# Patient Record
Sex: Female | Born: 1980 | Race: Black or African American | Hispanic: No | Marital: Married | Smoking: Never smoker
Health system: Southern US, Community
[De-identification: ages and names within clinical notes are randomized; demographics above are authoritative.]

## PROBLEM LIST (undated history)

## (undated) DIAGNOSIS — R7611 Nonspecific reaction to tuberculin skin test without active tuberculosis: Secondary | ICD-10-CM

## (undated) HISTORY — DX: Nonspecific reaction to tuberculin skin test without active tuberculosis: R76.11

## (undated) HISTORY — PX: UMBILICAL HERNIA REPAIR: SHX196

---

## 2011-10-06 DIAGNOSIS — S0502XA Injury of conjunctiva and corneal abrasion without foreign body, left eye, initial encounter: Secondary | ICD-10-CM | POA: Insufficient documentation

## 2011-10-06 DIAGNOSIS — S058X9A Other injuries of unspecified eye and orbit, initial encounter: Secondary | ICD-10-CM | POA: Insufficient documentation

## 2014-10-04 DIAGNOSIS — R42 Dizziness and giddiness: Secondary | ICD-10-CM | POA: Insufficient documentation

## 2014-11-03 DIAGNOSIS — B951 Streptococcus, group B, as the cause of diseases classified elsewhere: Secondary | ICD-10-CM | POA: Insufficient documentation

## 2016-08-28 DIAGNOSIS — Z111 Encounter for screening for respiratory tuberculosis: Secondary | ICD-10-CM | POA: Diagnosis not present

## 2016-08-28 DIAGNOSIS — O0992 Supervision of high risk pregnancy, unspecified, second trimester: Secondary | ICD-10-CM | POA: Diagnosis not present

## 2016-08-28 DIAGNOSIS — O0930 Supervision of pregnancy with insufficient antenatal care, unspecified trimester: Secondary | ICD-10-CM | POA: Diagnosis not present

## 2016-08-31 ENCOUNTER — Other Ambulatory Visit (HOSPITAL_COMMUNITY): Payer: Self-pay | Admitting: Family Medicine

## 2016-08-31 ENCOUNTER — Ambulatory Visit
Admission: RE | Admit: 2016-08-31 | Discharge: 2016-08-31 | Disposition: A | Discharge status: Home / Self Care | Payer: Medicaid Other | Source: Ambulatory Visit | Attending: Family Medicine | Admitting: Family Medicine

## 2016-08-31 DIAGNOSIS — R7611 Nonspecific reaction to tuberculin skin test without active tuberculosis: Secondary | ICD-10-CM | POA: Insufficient documentation

## 2016-09-15 DIAGNOSIS — O280 Abnormal hematological finding on antenatal screening of mother: Secondary | ICD-10-CM | POA: Insufficient documentation

## 2016-09-18 DIAGNOSIS — O3510X0 Maternal care for (suspected) chromosomal abnormality in fetus, unspecified, not applicable or unspecified: Secondary | ICD-10-CM | POA: Insufficient documentation

## 2017-08-28 LAB — HM PAP SMEAR: HM Pap smear: NEGATIVE

## 2017-08-28 LAB — RESULTS CONSOLE HPV: CHL HPV: NEGATIVE

## 2017-10-22 DIAGNOSIS — Z3201 Encounter for pregnancy test, result positive: Secondary | ICD-10-CM | POA: Diagnosis not present

## 2017-10-29 DIAGNOSIS — O99012 Anemia complicating pregnancy, second trimester: Secondary | ICD-10-CM | POA: Diagnosis not present

## 2017-10-29 DIAGNOSIS — O09522 Supervision of elderly multigravida, second trimester: Secondary | ICD-10-CM | POA: Diagnosis not present

## 2017-10-29 DIAGNOSIS — O0992 Supervision of high risk pregnancy, unspecified, second trimester: Secondary | ICD-10-CM | POA: Diagnosis not present

## 2017-11-07 DIAGNOSIS — Z8759 Personal history of other complications of pregnancy, childbirth and the puerperium: Secondary | ICD-10-CM | POA: Diagnosis not present

## 2017-11-07 DIAGNOSIS — Z3689 Encounter for other specified antenatal screening: Secondary | ICD-10-CM | POA: Diagnosis not present

## 2017-11-26 DIAGNOSIS — O0992 Supervision of high risk pregnancy, unspecified, second trimester: Secondary | ICD-10-CM | POA: Diagnosis not present

## 2017-11-26 DIAGNOSIS — O99012 Anemia complicating pregnancy, second trimester: Secondary | ICD-10-CM | POA: Diagnosis not present

## 2017-11-26 DIAGNOSIS — Z23 Encounter for immunization: Secondary | ICD-10-CM | POA: Diagnosis not present

## 2018-02-04 DIAGNOSIS — O0992 Supervision of high risk pregnancy, unspecified, second trimester: Secondary | ICD-10-CM | POA: Diagnosis not present

## 2018-02-19 DIAGNOSIS — O0992 Supervision of high risk pregnancy, unspecified, second trimester: Secondary | ICD-10-CM | POA: Diagnosis not present

## 2018-02-25 DIAGNOSIS — O0992 Supervision of high risk pregnancy, unspecified, second trimester: Secondary | ICD-10-CM | POA: Diagnosis not present

## 2018-03-04 DIAGNOSIS — O0992 Supervision of high risk pregnancy, unspecified, second trimester: Secondary | ICD-10-CM | POA: Diagnosis not present

## 2018-03-11 DIAGNOSIS — Z315 Encounter for genetic counseling: Secondary | ICD-10-CM | POA: Diagnosis not present

## 2018-03-11 DIAGNOSIS — O99012 Anemia complicating pregnancy, second trimester: Secondary | ICD-10-CM | POA: Diagnosis not present

## 2018-03-11 DIAGNOSIS — Z22330 Carrier of Group B streptococcus: Secondary | ICD-10-CM | POA: Diagnosis not present

## 2018-03-11 DIAGNOSIS — Z6790 Unspecified blood type, Rh positive: Secondary | ICD-10-CM | POA: Diagnosis not present

## 2018-03-11 DIAGNOSIS — O0930 Supervision of pregnancy with insufficient antenatal care, unspecified trimester: Secondary | ICD-10-CM | POA: Diagnosis not present

## 2018-03-11 DIAGNOSIS — O0992 Supervision of high risk pregnancy, unspecified, second trimester: Secondary | ICD-10-CM | POA: Diagnosis not present

## 2018-03-11 DIAGNOSIS — Z8759 Personal history of other complications of pregnancy, childbirth and the puerperium: Secondary | ICD-10-CM | POA: Diagnosis not present

## 2018-03-11 DIAGNOSIS — Z1159 Encounter for screening for other viral diseases: Secondary | ICD-10-CM | POA: Diagnosis not present

## 2018-03-11 DIAGNOSIS — Z7189 Other specified counseling: Secondary | ICD-10-CM | POA: Diagnosis not present

## 2018-03-11 DIAGNOSIS — R7611 Nonspecific reaction to tuberculin skin test without active tuberculosis: Secondary | ICD-10-CM | POA: Diagnosis not present

## 2018-03-18 DIAGNOSIS — O0992 Supervision of high risk pregnancy, unspecified, second trimester: Secondary | ICD-10-CM | POA: Diagnosis not present

## 2018-03-18 DIAGNOSIS — O4193X0 Disorder of amniotic fluid and membranes, unspecified, third trimester, not applicable or unspecified: Secondary | ICD-10-CM | POA: Diagnosis not present

## 2018-03-18 DIAGNOSIS — O88113 Amniotic fluid embolism in pregnancy, third trimester: Secondary | ICD-10-CM | POA: Diagnosis not present

## 2018-03-18 DIAGNOSIS — Z886 Allergy status to analgesic agent status: Secondary | ICD-10-CM | POA: Diagnosis not present

## 2018-03-18 DIAGNOSIS — Z3A39 39 weeks gestation of pregnancy: Secondary | ICD-10-CM | POA: Diagnosis not present

## 2018-03-20 DIAGNOSIS — O99019 Anemia complicating pregnancy, unspecified trimester: Secondary | ICD-10-CM | POA: Diagnosis not present

## 2018-03-20 DIAGNOSIS — O351XX0 Maternal care for (suspected) chromosomal abnormality in fetus, not applicable or unspecified: Secondary | ICD-10-CM | POA: Diagnosis not present

## 2018-03-20 DIAGNOSIS — Z3A39 39 weeks gestation of pregnancy: Secondary | ICD-10-CM | POA: Diagnosis not present

## 2018-03-20 DIAGNOSIS — O09522 Supervision of elderly multigravida, second trimester: Secondary | ICD-10-CM | POA: Diagnosis not present

## 2018-03-21 DIAGNOSIS — O99213 Obesity complicating pregnancy, third trimester: Secondary | ICD-10-CM | POA: Diagnosis not present

## 2018-03-21 DIAGNOSIS — E669 Obesity, unspecified: Secondary | ICD-10-CM | POA: Diagnosis not present

## 2018-03-21 DIAGNOSIS — O133 Gestational [pregnancy-induced] hypertension without significant proteinuria, third trimester: Secondary | ICD-10-CM | POA: Diagnosis not present

## 2018-03-21 DIAGNOSIS — Z3A39 39 weeks gestation of pregnancy: Secondary | ICD-10-CM | POA: Diagnosis not present

## 2018-03-21 DIAGNOSIS — O368390 Maternal care for abnormalities of the fetal heart rate or rhythm, unspecified trimester, not applicable or unspecified: Secondary | ICD-10-CM | POA: Diagnosis not present

## 2018-03-21 DIAGNOSIS — O24419 Gestational diabetes mellitus in pregnancy, unspecified control: Secondary | ICD-10-CM | POA: Diagnosis not present

## 2018-05-02 DIAGNOSIS — Z01419 Encounter for gynecological examination (general) (routine) without abnormal findings: Secondary | ICD-10-CM | POA: Diagnosis not present

## 2018-10-14 ENCOUNTER — Telehealth: Payer: Self-pay

## 2018-10-14 DIAGNOSIS — R7611 Nonspecific reaction to tuberculin skin test without active tuberculosis: Secondary | ICD-10-CM | POA: Insufficient documentation

## 2018-10-14 NOTE — Telephone Encounter (Signed)
TC to patient re: hx of +PPD and LTBI tx.  Patient declines LTBI tx at this time but will f/u with [facility] if decides to proceed. Aileen Fass, RN

## 2018-11-29 DIAGNOSIS — Z23 Encounter for immunization: Secondary | ICD-10-CM | POA: Diagnosis not present

## 2018-11-29 DIAGNOSIS — Z03818 Encounter for observation for suspected exposure to other biological agents ruled out: Secondary | ICD-10-CM | POA: Diagnosis not present

## 2018-11-29 DIAGNOSIS — Z7189 Other specified counseling: Secondary | ICD-10-CM | POA: Diagnosis not present

## 2018-11-29 DIAGNOSIS — Z20828 Contact with and (suspected) exposure to other viral communicable diseases: Secondary | ICD-10-CM | POA: Diagnosis not present

## 2018-12-22 IMAGING — CR DG CHEST 1V
1 series · 1 of 1 positions shown · non-contrast
Comparison: None.

CLINICAL DATA: Positive PPD, 16 weeks pregnant, lead shielded

EXAM:
CHEST 1 VIEW

[chest pa]
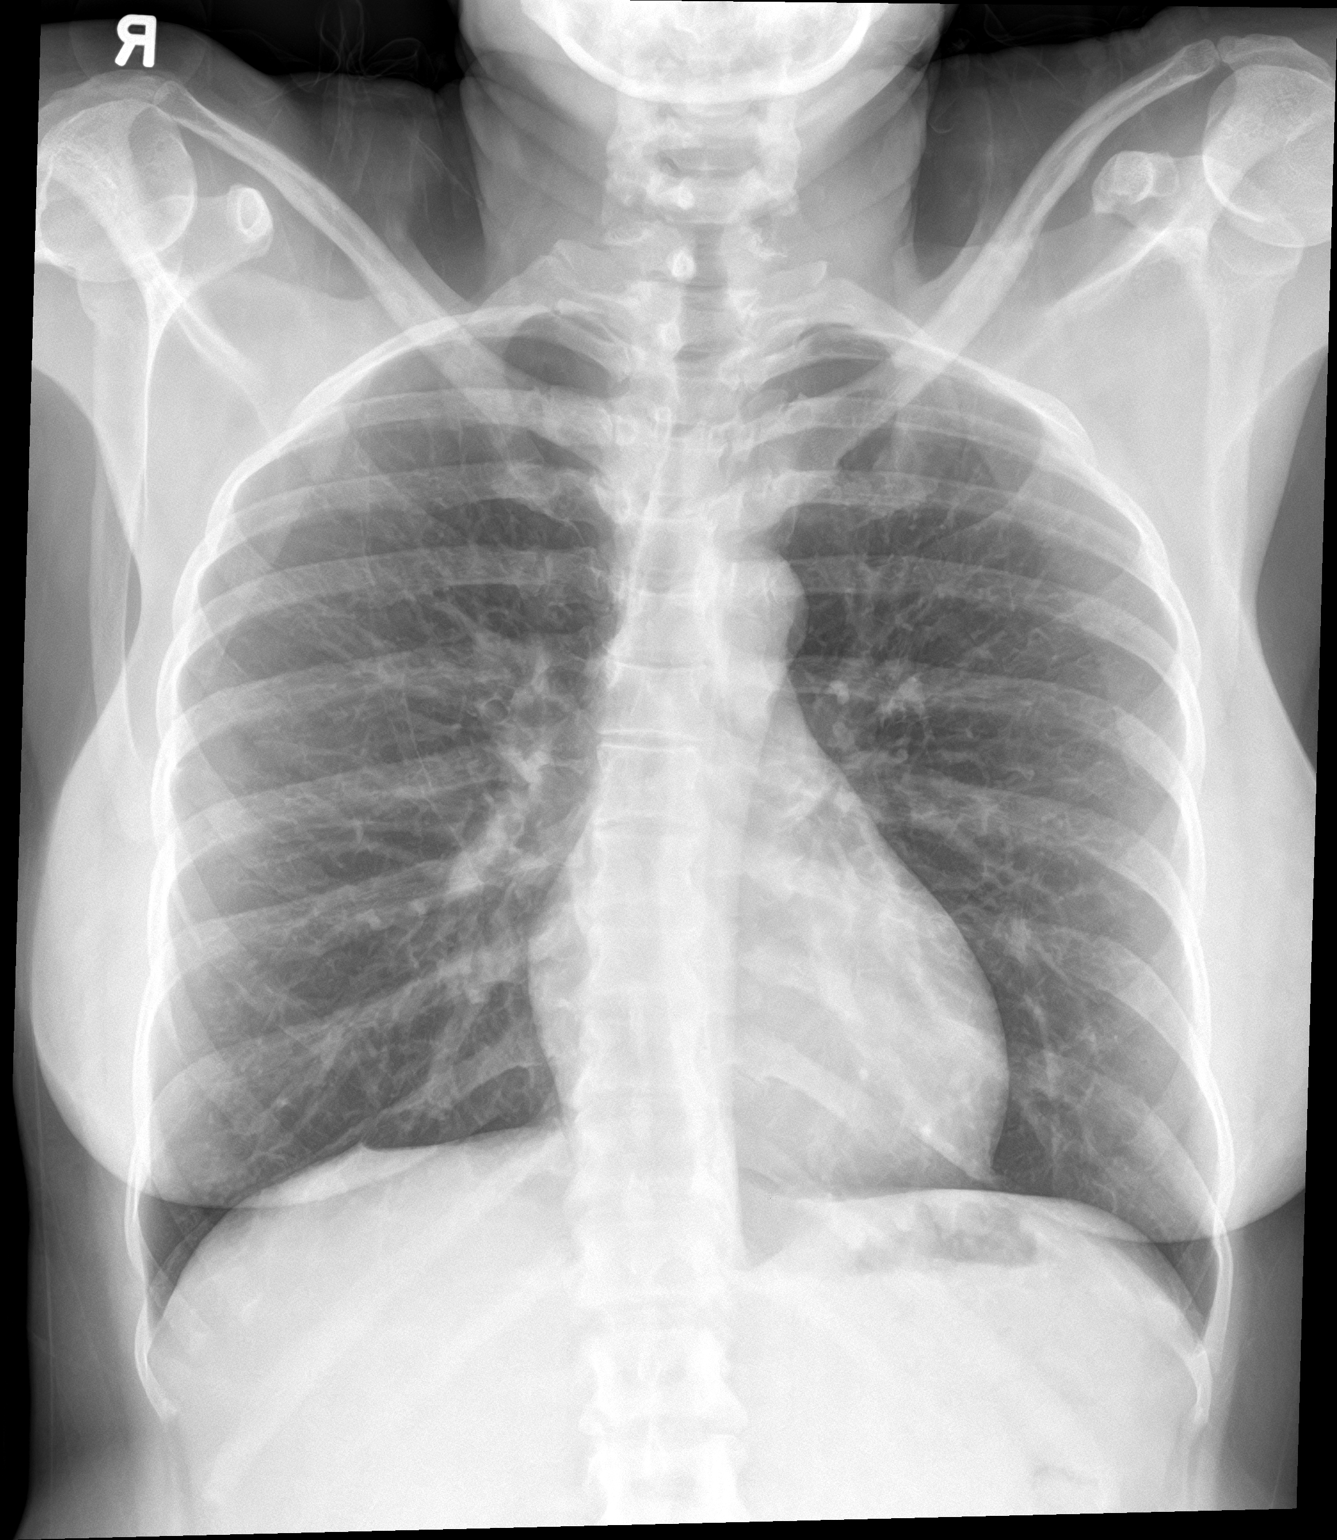

[1 of 1 positions shown; findings below may reference images not displayed]

FINDINGS: No active infiltrate or effusion is seen. No sequela of prior
tuberculous infection is noted. Mediastinal and hilar contours are
unremarkable. The heart is within normal limits in size. No bony
abnormality is seen.
IMPRESSION: No active disease.

## 2019-05-23 DIAGNOSIS — Z23 Encounter for immunization: Secondary | ICD-10-CM | POA: Diagnosis not present

## 2019-06-09 ENCOUNTER — Other Ambulatory Visit: Payer: Self-pay

## 2019-06-09 ENCOUNTER — Encounter: Payer: Self-pay | Admitting: Family Medicine

## 2019-06-09 ENCOUNTER — Ambulatory Visit (INDEPENDENT_AMBULATORY_CARE_PROVIDER_SITE_OTHER): Payer: Medicaid Other | Admitting: Family Medicine

## 2019-06-09 VITALS — BP 110/80 | HR 72 | Ht 62.0 in | Wt 134.0 lb

## 2019-06-09 DIAGNOSIS — Z862 Personal history of diseases of the blood and blood-forming organs and certain disorders involving the immune mechanism: Secondary | ICD-10-CM

## 2019-06-09 DIAGNOSIS — M20021 Boutonniere deformity of right finger(s): Secondary | ICD-10-CM

## 2019-06-09 DIAGNOSIS — Z7689 Persons encountering health services in other specified circumstances: Secondary | ICD-10-CM | POA: Diagnosis not present

## 2019-06-09 NOTE — Progress Notes (Signed)
Date:  06/09/2019   Name:  Erika West   DOB:  Dec 27, 1980   MRN:  [id number]   Chief Complaint: Establish Care (needs pcp) and finger deformity (R) pinky finger- fell and "dislocated finger"- now doesn't feel as flexible as the other fingers)  Patient is a 39 year old female who presents for a establish care exam. The patient reports the following problems: finger deformity after fall/> 1 year ago.Marland Kitchen Health maintenance has been reviewed needs pap.   No results found for: CREATININE, BUN, NA, K, CL, CO2 No results found for: CHOL, HDL, LDLCALC, LDLDIRECT, TRIG, CHOLHDL No results found for: TSH No results found for: HGBA1C No results found for: WBC, HGB, HCT, MCV, PLT No results found for: ALT, AST, GGT, ALKPHOS, BILITOT   Review of Systems  Constitutional: Negative.  Negative for chills, fatigue, fever and unexpected weight change.  HENT: Negative for congestion, ear discharge, ear pain, rhinorrhea, sinus pressure, sneezing and sore throat.   Eyes: Negative for photophobia, pain, discharge, redness and itching.  Respiratory: Negative for cough, shortness of breath, wheezing and stridor.   Gastrointestinal: Negative for abdominal pain, blood in stool, constipation, diarrhea, nausea and vomiting.  Endocrine: Negative for cold intolerance, heat intolerance, polydipsia, polyphagia and polyuria.  Genitourinary: Negative for dysuria, flank pain, frequency, hematuria, menstrual problem, pelvic pain, urgency, vaginal bleeding and vaginal discharge.  Musculoskeletal: Negative for arthralgias, back pain and myalgias.  Skin: Negative for rash.  Allergic/Immunologic: Negative for environmental allergies and food allergies.  Neurological: Negative for dizziness, weakness, light-headedness, numbness and headaches.  Hematological: Negative for adenopathy. Does not bruise/bleed easily.  Psychiatric/Behavioral: Negative for dysphoric mood. The patient is not nervous/anxious.      Patient Active Problem List   Diagnosis Date Noted  . 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 Abnormal maternal serum screening test 09/15/2016    Allergies  Allergen Reactions  . Aspirin Other (See Comments)    ulcers Ulcer, told not to take it   . Sulfamethoxazole-Trimethoprim Other (See Comments)    History of ulcer so was advised by MD not to take    History reviewed. No pertinent surgical history.  Social History   Tobacco Use  . Smoking status: Never Smoker  . Smokeless tobacco: Never Used  Substance Use Topics  . Alcohol use: Not Currently    Alcohol/week: 1.0 standard drinks    Types: 1 Glasses of wine per week    Comment: occasional  . Drug use: Never     Medication list has been reviewed and updated.  Current Meds  Medication Sig  . Prenatal Vit-Fe Fumarate-FA (RIGHT STEP PRENATAL) 27-0.8 MG TABS Take by mouth.    PHQ 2/9 Scores 06/09/2019  PHQ - 2 Score 0  PHQ- 9 Score 0    BP Readings from Last 3 Encounters:  06/09/19 110/80    Physical Exam Vitals and nursing note reviewed.  Constitutional:      General: She is not in acute distress.    Appearance: She is not diaphoretic.  HENT:     Head: Normocephalic and atraumatic.     Right Ear: Tympanic membrane, ear canal and external ear normal.     Left Ear: Tympanic membrane, ear canal and external ear normal.     Nose: Nose normal. No congestion or rhinorrhea.     Mouth/Throat:     Mouth: Mucous membranes are moist. Mucous membranes are pale.  Eyes:     General: Lids are normal.  Right eye: No discharge.        Left eye: No discharge.     Conjunctiva/sclera: Conjunctivae normal.     Pupils: Pupils are equal, round, and reactive to light.  Neck:     Thyroid: No thyromegaly.     Vascular: No JVD.  Cardiovascular:     Rate and Rhythm: Normal rate and regular rhythm.     Heart sounds: Normal heart sounds. No murmur. No friction rub. No gallop.   Pulmonary:     Effort: Pulmonary effort is  normal.     Breath sounds: Normal breath sounds.  Abdominal:     General: Bowel sounds are normal.     Palpations: Abdomen is soft. There is no mass.     Tenderness: There is no abdominal tenderness. There is no guarding.  Musculoskeletal:        General: Normal range of motion.     Cervical back: Normal range of motion and neck supple.  Lymphadenopathy:     Cervical: No cervical adenopathy.  Skin:    General: Skin is warm and dry.     Capillary Refill: Capillary refill takes 2 to 3 seconds.  Neurological:     Mental Status: She is alert.     Deep Tendon Reflexes: Reflexes are normal and symmetric.     Wt Readings from Last 3 Encounters:  06/09/19 134 lb (60.8 kg)    BP 110/80   Pulse 72   Ht 5\' 2"  (1.575 m)   Wt 134 lb (60.8 kg)   BMI 24.51 kg/m   Assessment and Plan:  1. Establishing care with new doctor, encounter for Patient establishing care with new physician.  Patient's previous encounters were reviewed as well as most recent labs, most recent imaging, and care everywhere.  Concerns as noted below on history and physical exam were determined.  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 - Ambulatory referral to Orthopedic Surgery  3. History of anemia Patient has a history of anemia on review of her history.  Exam is consistent with paleness of the conjunctiva and mucous membranes and nailbeds as well.  We will obtain a CBC with differential and indices for evaluation and determination of further therapy pending. - CBC with Differential/Platelet

## 2019-06-10 LAB — CBC WITH DIFFERENTIAL/PLATELET
Basophils Absolute: 0.1 10*3/uL (ref 0.0–0.2)
Basos: 1 %
EOS (ABSOLUTE): 0.2 10*3/uL (ref 0.0–0.4)
Eos: 2 %
Hematocrit: 41.3 % (ref 34.0–46.6)
Hemoglobin: 13 g/dL (ref 11.1–15.9)
Immature Grans (Abs): 0 10*3/uL (ref 0.0–0.1)
Immature Granulocytes: 0 %
Lymphocytes Absolute: 4.4 10*3/uL — ABNORMAL HIGH (ref 0.7–3.1)
Lymphs: 57 %
MCH: 27.8 pg (ref 26.6–33.0)
MCHC: 31.5 g/dL (ref 31.5–35.7)
MCV: 88 fL (ref 79–97)
Monocytes Absolute: 0.3 10*3/uL (ref 0.1–0.9)
Monocytes: 4 %
Neutrophils Absolute: 2.8 10*3/uL (ref 1.4–7.0)
Neutrophils: 36 %
Platelets: 355 10*3/uL (ref 150–450)
RBC: 4.67 x10E6/uL (ref 3.77–5.28)
RDW: 13.1 % (ref 11.7–15.4)
WBC: 7.7 10*3/uL (ref 3.4–10.8)

## 2019-06-23 DIAGNOSIS — M20021 Boutonniere deformity of right finger(s): Secondary | ICD-10-CM | POA: Diagnosis not present

## 2019-06-23 DIAGNOSIS — M79644 Pain in right finger(s): Secondary | ICD-10-CM | POA: Diagnosis not present

## 2019-07-28 ENCOUNTER — Encounter: Payer: Self-pay | Admitting: Family Medicine

## 2019-07-28 ENCOUNTER — Ambulatory Visit (INDEPENDENT_AMBULATORY_CARE_PROVIDER_SITE_OTHER): Payer: Medicaid Other | Admitting: Family Medicine

## 2019-07-28 ENCOUNTER — Other Ambulatory Visit: Payer: Self-pay

## 2019-07-28 VITALS — BP 120/70 | HR 64 | Ht 62.0 in | Wt 136.0 lb

## 2019-07-28 DIAGNOSIS — Z862 Personal history of diseases of the blood and blood-forming organs and certain disorders involving the immune mechanism: Secondary | ICD-10-CM

## 2019-07-28 DIAGNOSIS — Z0000 Encounter for general adult medical examination without abnormal findings: Secondary | ICD-10-CM | POA: Diagnosis not present

## 2019-07-28 NOTE — Progress Notes (Signed)
Date:  07/28/2019   Name:  Erika West   DOB:  1980-08-28   MRN:  [id number]   Chief Complaint: Annual Exam ( no issues- had pap last year)  Patient is a 39 year old female who presents for a comprehensive physical exam. The patient reports the following problems: none. Health maintenance has been reviewed up to date/pap health dept.   No results found for: CREATININE, BUN, NA, K, CL, CO2 No results found for: CHOL, HDL, LDLCALC, LDLDIRECT, TRIG, CHOLHDL No results found for: TSH No results found for: HGBA1C Lab Results  Component Value Date   WBC 7.7 06/09/2019   HGB 13.0 06/09/2019   HCT 41.3 06/09/2019   MCV 88 06/09/2019   PLT 355 06/09/2019   No results found for: ALT, AST, GGT, ALKPHOS, BILITOT   Review of Systems  Constitutional: Negative.  Negative for chills, fatigue, fever and unexpected weight change.  HENT: Negative for congestion, ear discharge, ear pain, rhinorrhea, sinus pressure, sneezing and sore throat.   Eyes: Negative for photophobia, pain, discharge, redness and itching.  Respiratory: Negative for cough, shortness of breath, wheezing and stridor.   Gastrointestinal: Negative for abdominal pain, blood in stool, constipation, diarrhea, nausea and vomiting.  Endocrine: Negative for cold intolerance, heat intolerance, polydipsia, polyphagia and polyuria.  Genitourinary: Negative for dysuria, flank pain, frequency, hematuria, menstrual problem, pelvic pain, urgency, vaginal bleeding and vaginal discharge.  Musculoskeletal: Negative for arthralgias, back pain and myalgias.  Skin: Negative for rash.  Allergic/Immunologic: Negative for environmental allergies and food allergies.  Neurological: Negative for dizziness, weakness, light-headedness, numbness and headaches.  Hematological: Negative for adenopathy. Does not bruise/bleed easily.  Psychiatric/Behavioral: Negative for dysphoric mood. The patient is not nervous/anxious.     Patient Active  Problem List   Diagnosis Date Noted  . 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 Abnormal maternal serum screening test 09/15/2016    Allergies  Allergen Reactions  . Aspirin Other (See Comments)    ulcers Ulcer, told not to take it   . Sulfamethoxazole-Trimethoprim Other (See Comments)    History of ulcer so was advised by MD not to take    No past surgical history on file.  Social History   Tobacco Use  . Smoking status: Never Smoker  . Smokeless tobacco: Never Used  Substance Use Topics  . Alcohol use: Not Currently    Alcohol/week: 1.0 standard drink    Types: 1 Glasses of wine per week    Comment: occasional  . Drug use: Never     Medication list has been reviewed and updated.  Current Meds  Medication Sig  . Prenatal Vit-Fe Fumarate-FA (PNV PRENATAL PLUS MULTIVITAMIN) 27-1 MG TABS Take by mouth.    PHQ 2/9 Scores 07/28/2019 06/09/2019  PHQ - 2 Score 0 0  PHQ- 9 Score 0 0    GAD 7 : Generalized Anxiety Score 07/28/2019 06/09/2019  Nervous, Anxious, on Edge 0 0  Control/stop worrying 0 0  Worry too much - different things 0 0  Trouble relaxing 0 0  Restless 0 0  Easily annoyed or irritable 0 0  Afraid - awful might happen 0 0  Total GAD 7 Score 0 0    BP Readings from Last 3 Encounters:  07/28/19 120/70  06/09/19 110/80    Physical Exam Vitals and nursing note reviewed.  Constitutional:      General: She is not in acute distress.    Appearance: She is not diaphoretic.  HENT:  Head: Normocephalic and atraumatic.     Right Ear: Tympanic membrane, ear canal and external ear normal.     Left Ear: Tympanic membrane, ear canal and external ear normal.     Nose: Nose normal.  Eyes:     General:        Right eye: No discharge.        Left eye: No discharge.     Conjunctiva/sclera: Conjunctivae normal.     Pupils: Pupils are equal, round, and reactive to light.  Neck:     Thyroid: No thyromegaly.     Vascular: No JVD.  Cardiovascular:     Rate and Rhythm:  Normal rate and regular rhythm.     Heart sounds: Normal heart sounds, S1 normal and S2 normal. No murmur heard.  No systolic murmur is present.  No diastolic murmur is present.  No friction rub. No gallop. No S3 or S4 sounds.   Pulmonary:     Effort: Pulmonary effort is normal.     Breath sounds: Normal breath sounds. No decreased breath sounds, wheezing, rhonchi or rales.  Chest:     Breasts:        Right: Normal. No swelling, bleeding, inverted nipple, mass, nipple discharge, skin change or tenderness.        Left: Normal. No swelling, bleeding, inverted nipple, mass, nipple discharge, skin change or tenderness.  Abdominal:     General: Bowel sounds are normal.     Palpations: Abdomen is soft. There is no mass.     Tenderness: There is no abdominal tenderness. There is no guarding.  Genitourinary:    Rectum: Normal. Guaiac result negative. No mass or tenderness.  Musculoskeletal:        General: Normal range of motion.     Cervical back: Normal range of motion and neck supple.     Right lower leg: No edema.     Left lower leg: No edema.  Lymphadenopathy:     Cervical: No cervical adenopathy.     Upper Body:     Right upper body: No supraclavicular or axillary adenopathy.     Left upper body: No supraclavicular or axillary adenopathy.  Skin:    General: Skin is warm and dry.  Neurological:     Mental Status: She is alert.     Deep Tendon Reflexes: Reflexes are normal and symmetric.     Wt Readings from Last 3 Encounters:  07/28/19 136 lb (61.7 kg)  06/09/19 134 lb (60.8 kg)    BP 120/70   Pulse 64   Ht 5\' 2"  (1.575 m)   Wt 136 lb (61.7 kg)   LMP 06/24/2019 (Approximate)   Breastfeeding Yes   BMI 24.87 kg/m   Assessment and Plan: 1. Annual physical exam No subjective/objective concerns noted during history and physical exam.  Patient's chart was reviewed and there were no issues noted in her previous encounters, most recent labs, most recent imaging, and care  everywhere.Erika West is a 39 y.o. female who presents today for her Complete Annual Exam. She feels well. She reports exercising occassional. She reports she is sleeping well. Immunizations are reviewed and recommendations provided.   Age appropriate screening tests are discussed. Counseling given for risk factor reduction interventions.  We will evaluate with renal function panel lipid panel as well as CBC given history of anemia. - Renal Function Panel - Lipid Panel With LDL/HDL Ratio - CBC with Differential/Platelet  2. History of anemia Chronic.  Controlled.  Stable.  Will check CBC given the patient's history of anemia and mild decrease in hemoglobin/hematocrit in the past. - CBC with Differential/Platelet

## 2019-07-29 LAB — CBC WITH DIFFERENTIAL/PLATELET
Basophils Absolute: 0.1 10*3/uL (ref 0.0–0.2)
Basos: 1 %
EOS (ABSOLUTE): 0.1 10*3/uL (ref 0.0–0.4)
Eos: 2 %
Hematocrit: 41.4 % (ref 34.0–46.6)
Hemoglobin: 13.1 g/dL (ref 11.1–15.9)
Immature Grans (Abs): 0 10*3/uL (ref 0.0–0.1)
Immature Granulocytes: 0 %
Lymphocytes Absolute: 3.2 10*3/uL — ABNORMAL HIGH (ref 0.7–3.1)
Lymphs: 48 %
MCH: 27.5 pg (ref 26.6–33.0)
MCHC: 31.6 g/dL (ref 31.5–35.7)
MCV: 87 fL (ref 79–97)
Monocytes Absolute: 0.3 10*3/uL (ref 0.1–0.9)
Monocytes: 5 %
Neutrophils Absolute: 2.9 10*3/uL (ref 1.4–7.0)
Neutrophils: 44 %
Platelets: 311 10*3/uL (ref 150–450)
RBC: 4.77 x10E6/uL (ref 3.77–5.28)
RDW: 13 % (ref 11.7–15.4)
WBC: 6.5 10*3/uL (ref 3.4–10.8)

## 2019-07-29 LAB — RENAL FUNCTION PANEL
Albumin: 4.8 g/dL (ref 3.8–4.8)
BUN/Creatinine Ratio: 14 (ref 9–23)
BUN: 9 mg/dL (ref 6–20)
CO2: 22 mmol/L (ref 20–29)
Calcium: 9.6 mg/dL (ref 8.7–10.2)
Chloride: 103 mmol/L (ref 96–106)
Creatinine, Ser: 0.66 mg/dL (ref 0.57–1.00)
GFR calc Af Amer: 130 mL/min/{1.73_m2} (ref >59)
GFR calc non Af Amer: 112 mL/min/{1.73_m2} (ref >59)
Glucose: 93 mg/dL (ref 65–99)
Phosphorus: 3.8 mg/dL (ref 3.0–4.3)
Potassium: 4.8 mmol/L (ref 3.5–5.2)
Sodium: 140 mmol/L (ref 134–144)

## 2019-07-29 LAB — LIPID PANEL WITH LDL/HDL RATIO
Cholesterol, Total: 192 mg/dL (ref 100–199)
HDL: 66 mg/dL (ref >39)
LDL Chol Calc (NIH): 114 mg/dL — ABNORMAL HIGH (ref 0–99)
LDL/HDL Ratio: 1.7 ratio (ref 0.0–3.2)
Triglycerides: 65 mg/dL (ref 0–149)
VLDL Cholesterol Cal: 12 mg/dL (ref 5–40)

## 2019-07-30 ENCOUNTER — Telehealth: Payer: Self-pay | Admitting: Family Medicine

## 2019-07-30 NOTE — Telephone Encounter (Unsigned)
Copied from CRM [id number]. Topic: General - Call Back - No Documentation >> Jul 30, 2019  2:24 PM Reuben Likes D wrote: Reason for CRM: Patient received voicemail advising her to contact the office back, patient is unsure reason for call. Patient has already spoke to nurse regarding lab results  On 07/29/2019

## 2019-07-31 NOTE — Telephone Encounter (Signed)
Called pt back concerning lab being acceptable

## 2019-10-13 ENCOUNTER — Encounter: Payer: Self-pay | Admitting: Family Medicine

## 2019-10-13 ENCOUNTER — Other Ambulatory Visit: Payer: Self-pay

## 2019-10-13 ENCOUNTER — Ambulatory Visit (INDEPENDENT_AMBULATORY_CARE_PROVIDER_SITE_OTHER): Payer: Medicaid Other | Admitting: Family Medicine

## 2019-10-13 VITALS — BP 112/82 | HR 76 | Ht 62.0 in | Wt 132.0 lb

## 2019-10-13 DIAGNOSIS — Z23 Encounter for immunization: Secondary | ICD-10-CM | POA: Diagnosis not present

## 2019-10-13 DIAGNOSIS — L700 Acne vulgaris: Secondary | ICD-10-CM

## 2019-10-13 MED ORDER — ERYTHROMYCIN BASE 500 MG PO TABS: 500.0000 mg | ORAL_TABLET | Freq: Two times a day (BID) | ORAL | 1 refills | Status: DC

## 2019-10-13 MED ORDER — ERYTHROMYCIN BASE 500 MG PO TABS: 500.0000 mg | ORAL_TABLET | Freq: Two times a day (BID) | ORAL | 0 refills | Status: DC

## 2019-10-13 NOTE — Progress Notes (Signed)
Date:  10/13/2019   Name:  Erika West   DOB:  1980/11/25   MRN:  [id number]   Chief Complaint: Rash (X1 month, face (all over), hurts and burns, gives her a headache ) and Flu Vaccine  Rash This is a recurrent (2013-2014) problem. The current episode started more than 1 month ago (4-6 weeks). The problem has been gradually worsening since onset. The affected locations include the face. Rash characteristics: comedones. Associated with: using coconut oil. Treatments tried: topical coconut oil. The treatment provided mild relief.    Lab Results  Component Value Date   CREATININE 0.66 07/28/2019   BUN 9 07/28/2019   NA 140 07/28/2019   K 4.8 07/28/2019   CL 103 07/28/2019   CO2 22 07/28/2019   Lab Results  Component Value Date   CHOL 192 07/28/2019   HDL 66 07/28/2019   LDLCALC 114 (H) 07/28/2019   TRIG 65 07/28/2019   No results found for: TSH No results found for: HGBA1C Lab Results  Component Value Date   WBC 6.5 07/28/2019   HGB 13.1 07/28/2019   HCT 41.4 07/28/2019   MCV 87 07/28/2019   PLT 311 07/28/2019   No results found for: ALT, AST, GGT, ALKPHOS, BILITOT   Review of Systems  Skin: Positive for rash.    Patient Active Problem List   Diagnosis Date Noted  . 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 Abnormal maternal serum screening test 09/15/2016    Allergies  Allergen Reactions  . Aspirin Other (See Comments)    ulcers Ulcer, told not to take it   . Sulfamethoxazole-Trimethoprim Other (See Comments)    History of ulcer so was advised by MD not to take    History reviewed. No pertinent surgical history.  Social History   Tobacco Use  . Smoking status: Never Smoker  . Smokeless tobacco: Never Used  Substance Use Topics  . Alcohol use: Not Currently    Alcohol/week: 1.0 standard drink    Types: 1 Glasses of wine per week    Comment: occasional  . Drug use: Never     Medication list has been reviewed and updated.  Current Meds    Medication Sig  . acetaminophen (TYLENOL) 500 MG tablet Take by mouth as needed.   . Prenatal Vit-Fe Fumarate-FA (PNV PRENATAL PLUS MULTIVITAMIN) 27-1 MG TABS Take by mouth.    PHQ 2/9 Scores 10/13/2019 07/28/2019 06/09/2019  PHQ - 2 Score 0 0 0  PHQ- 9 Score 0 0 0    GAD 7 : Generalized Anxiety Score 10/13/2019 07/28/2019 06/09/2019  Nervous, Anxious, on Edge 0 0 0  Control/stop worrying 0 0 0  Worry too much - different things 0 0 0  Trouble relaxing 0 0 0  Restless 0 0 0  Easily annoyed or irritable 0 0 0  Afraid - awful might happen 0 0 0  Total GAD 7 Score 0 0 0  Anxiety Difficulty Not difficult at all - -    BP Readings from Last 3 Encounters:  10/13/19 112/82  07/28/19 120/70  06/09/19 110/80    Physical Exam Vitals and nursing note reviewed.  Constitutional:      General: She is not in acute distress.    Appearance: She is not diaphoretic.  HENT:     Head: Normocephalic and atraumatic.     Right Ear: External ear normal.     Left Ear: External ear normal.     Nose: Nose normal.  Eyes:  General:        Right eye: No discharge.        Left eye: No discharge.     Conjunctiva/sclera: Conjunctivae normal.     Pupils: Pupils are equal, round, and reactive to light.  Neck:     Thyroid: No thyromegaly.     Vascular: No JVD.  Cardiovascular:     Rate and Rhythm: Normal rate and regular rhythm.     Heart sounds: Normal heart sounds. No murmur heard.  No friction rub. No gallop.   Pulmonary:     Effort: Pulmonary effort is normal.     Breath sounds: Normal breath sounds.  Abdominal:     General: Bowel sounds are normal.     Palpations: Abdomen is soft. There is no mass.     Tenderness: There is no abdominal tenderness. There is no guarding.  Musculoskeletal:        General: Normal range of motion.     Cervical back: Normal range of motion and neck supple.  Lymphadenopathy:     Cervical: No cervical adenopathy.  Skin:    General: Skin is warm and dry.      Findings: Rash present. Rash is papular and pustular.     Comments: Open and closed comedones  Neurological:     Mental Status: She is alert.     Deep Tendon Reflexes: Reflexes are normal and symmetric.     Wt Readings from Last 3 Encounters:  10/13/19 132 lb (59.9 kg)  07/28/19 136 lb (61.7 kg)  06/09/19 134 lb (60.8 kg)    BP 112/82   Pulse 76   Ht 5\' 2"  (1.575 m)   Wt 132 lb (59.9 kg)   BMI 24.14 kg/m   Assessment and Plan: 1. Need for immunization against influenza Discussed and administered - Flu Vaccine QUAD 36+ mos IM  2. Acne vulgaris New onset.  Persistent.  Gradually worsening/uncontrolled.  Patient began having acneiform-like breakout of the crusted forehead and eventually involving the malar areas of the face chin.  There are papules, pustules, and comedones noted.  Patient was on a contraceptive patch which is been discontinued in March.  Patient is continue to breast-feed which limits some of our options.  We will avoid clindamycin and instead use erythromycin 500 mg twice a day.  Patient has been instructed to use a daily cleansing with nonperfumed, nonmoisturizing-based soap and the pat dry and not to traumatize the papules.  Appointment has been made with dermatology for further evaluation and treatment.  Patient has been using a coconut oil preparation on her face and she has been instructed to continue its use and that it may be exacerbating it from the standpoint of the oral/coconut preparation.  Patient is not currently sexually active however we may need to involve her GYN for further evaluation and perhaps selection of a birth control that could help her with her acne but not interfere with her lactation. - Ambulatory referral to Dermatology - erythromycin base (E-MYCIN) 500 MG tablet; Take 1 tablet (500 mg total) by mouth 2 (two) times daily.  Dispense: 60 tablet; Refill: 1

## 2019-10-13 NOTE — Patient Instructions (Signed)
Acne  Acne is a skin problem that causes pimples and other skin changes. The skin has many tiny openings called pores. Each pore contains an oil gland. Oil glands make an oily substance that is called sebum. Acne occurs when the pores in the skin get blocked. The pores may become infected with bacteria, or they may become red, sore, and swollen. Acne is a common skin problem, especially for teenagers. It often occurs on the face, neck, chest, upper arms, and back. Acne usually goes away over time. What are the causes? Acne is caused when oil glands get blocked with sebum, dead skin cells, and dirt. The bacteria that are normally found in the oil glands then multiply and cause inflammation. Acne is commonly triggered by changes in your hormones. These hormonal changes can cause the oil glands to get bigger and to make more sebum. Factors that can make acne worse include:  Hormone changes during: ? Adolescence. ? Women's menstrual cycles. ? Pregnancy.  Oil-based cosmetics and hair products.  Stress.  Hormone problems that are caused by certain diseases.  Certain medicines.  Pressure from headbands, backpacks, or shoulder pads.  Exposure to certain oils and chemicals.  Eating a diet high in carbohydrates that quickly turn to sugar. These include dairy products, desserts, and chocolates. What increases the risk? This condition is more likely to develop in:  Teenagers.  People who have a family history of acne. What are the signs or symptoms? Symptoms include:  Small, red bumps (pimples or papules).  Whiteheads.  Blackheads.  Small, pus-filled pimples (pustules).  Big, red pimples or pustules that feel tender. More severe acne can cause:  An abscess. This is an infected area that contains a collection of pus.  Cysts. These are hard, painful, fluid-filled sacs.  Scars. These can happen after large pimples heal. How is this diagnosed? This condition is diagnosed with a  medical history and physical exam. Blood tests may also be done. How is this treated? Treatment for this condition can vary depending on the severity of your acne. Treatment may include:  Creams and lotions that prevent oil glands from clogging.  Creams and lotions that treat or prevent infections and inflammation.  Antibiotic medicines that are applied to the skin or taken as a pill.  Pills that decrease sebum production.  Birth control pills.  Light or laser treatments.  Injections of medicine into the affected areas.  Chemicals that cause peeling of the skin.  Surgery. Your health care provider will also recommend the best way to take care of your skin. Good skin care is the most important part of treatment. Follow these instructions at home: Skin care Take care of your skin as told by your health care provider. You may be told to do these things:  Wash your skin gently at least two times each day, as well as: ? After you exercise. ? Before you go to bed.  Use mild soap.  Apply a water-based skin moisturizer after you wash your skin.  Use a sunscreen or sunblock with SPF 30 or greater. This is especially important if you are using acne medicines.  Choose cosmetics that will not block your oil glands (are noncomedogenic). Medicines  Take over-the-counter and prescription medicines only as told by your health care provider.  If you were prescribed an antibiotic medicine, apply it or take it as told by your health care provider. Do not stop using the antibiotic even if your condition improves. General instructions  Keep your   hair clean and off your face. If you have oily hair, shampoo your hair regularly or daily.  Avoid wearing tight headbands or hats.  Avoid picking or squeezing your pimples. That can make your acne worse and cause scarring.  Shave gently and only when necessary.  Keep a food journal to figure out if any foods are linked to your acne. Avoid dairy  products, desserts, and chocolates.  Take steps to manage and reduce stress.  Keep all follow-up visits as told by your health care provider. This is important. Contact a health care provider if:  Your acne is not better after eight weeks.  Your acne gets worse.  You have a large area of skin that is red or tender.  You think that you are having side effects from any acne medicine. Summary  Acne is a skin problem that causes pimples and other skin changes. Acne is a common skin problem, especially for teenagers. Acne usually goes away over time.  Acne is commonly triggered by changes in your hormones. There are many other causes, such as stress, diet, and certain medicines.  Follow your health care provider's instructions for how to take care of your skin. Good skin care is the most important part of treatment.  Take over-the-counter and prescription medicines only as told by your health care provider.  Contact your health care provider if you think that you are having side effects from any acne medicine. This information is not intended to replace advice given to you by your health care provider. Make sure you discuss any questions you have with your health care provider. Document Revised: 05/29/2017 Document Reviewed: 05/29/2017 [employer] Patient Education  2020 [employer]  

## 2019-10-15 ENCOUNTER — Telehealth: Payer: Self-pay | Admitting: Family Medicine

## 2019-10-15 NOTE — Telephone Encounter (Unsigned)
Copied from CRM [id number]. Topic: General - Other >> Oct 15, 2019  8:15 AM Gwenlyn Fudge wrote: Reason for CRM: Pt called and is requesting to speak with nurse regarding the medication E-Mycin. She states that she received a call from nurse stating that the medication was supposed to be a solution instead of a tablet, but a tablet was sent in to the pharmacy. Requesting a callback to verify. Please advise.   [facility] [address] [place], [place] - [address] [place] [address] Phone: [phone] Fax: [phone] Hours: Not open 24 hours

## 2019-10-15 NOTE — Telephone Encounter (Signed)
Called pt with needing to start E-Mycin, due to breast feeding- [facility] was unable to prescribe a solution

## 2019-10-22 DIAGNOSIS — L700 Acne vulgaris: Secondary | ICD-10-CM | POA: Diagnosis not present

## 2019-10-22 DIAGNOSIS — Z79899 Other long term (current) drug therapy: Secondary | ICD-10-CM | POA: Diagnosis not present

## 2020-03-01 DIAGNOSIS — L218 Other seborrheic dermatitis: Secondary | ICD-10-CM | POA: Diagnosis not present

## 2020-03-01 DIAGNOSIS — L811 Chloasma: Secondary | ICD-10-CM | POA: Diagnosis not present

## 2020-03-01 DIAGNOSIS — L700 Acne vulgaris: Secondary | ICD-10-CM | POA: Diagnosis not present

## 2020-05-31 ENCOUNTER — Encounter: Payer: Medicaid Other | Admitting: Family Medicine

## 2020-05-31 ENCOUNTER — Ambulatory Visit: Payer: Medicaid Other | Admitting: Family Medicine

## 2020-05-31 ENCOUNTER — Other Ambulatory Visit: Payer: Self-pay

## 2020-05-31 VITALS — BP 120/80 | HR 72 | Ht 62.0 in | Wt 130.0 lb

## 2020-05-31 DIAGNOSIS — Z3002 Counseling and instruction in natural family planning to avoid pregnancy: Secondary | ICD-10-CM

## 2020-05-31 NOTE — Progress Notes (Signed)
Date:  05/31/2020   Name:  Erika West   DOB:  [birth year]   MRN:  [id number]   Chief Complaint: Contraception  Patient is a 40 year old female who presents for a contraceptive exam. The patient reports the following problems: birth control options. Health maintenance has been reviewed up to date.   Lab Results  Component Value Date   CREATININE 0.66 07/28/2019   BUN 9 07/28/2019   NA 140 07/28/2019   K 4.8 07/28/2019   CL 103 07/28/2019   CO2 22 07/28/2019   Lab Results  Component Value Date   CHOL 192 07/28/2019   HDL 66 07/28/2019   LDLCALC 114 (H) 07/28/2019   TRIG 65 07/28/2019   No results found for: TSH No results found for: HGBA1C Lab Results  Component Value Date   WBC 6.5 07/28/2019   HGB 13.1 07/28/2019   HCT 41.4 07/28/2019   MCV 87 07/28/2019   PLT 311 07/28/2019   No results found for: ALT, AST, GGT, ALKPHOS, BILITOT   Review of Systems  Constitutional: Negative for chills and fever.  HENT: Negative for drooling, ear discharge, ear pain and sore throat.   Respiratory: Negative for cough, shortness of breath and wheezing.   Cardiovascular: Negative for chest pain, palpitations and leg swelling.  Gastrointestinal: Negative for abdominal pain, blood in stool, constipation, diarrhea and nausea.  Endocrine: Negative for polydipsia.  Genitourinary: Negative for dysuria, frequency, hematuria and urgency.  Musculoskeletal: Negative for back pain, myalgias and neck pain.  Skin: Negative for rash.  Allergic/Immunologic: Negative for environmental allergies.  Neurological: Negative for dizziness and headaches.  Hematological: Does not bruise/bleed easily.  Psychiatric/Behavioral: Negative for suicidal ideas. The patient is not nervous/anxious.     Patient Active Problem List   Diagnosis Date Noted  . 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 Abnormal maternal serum screening test 09/15/2016    Allergies  Allergen Reactions  . Aspirin Other (See  Comments)    ulcers Ulcer, told not to take it   . Sulfamethoxazole-Trimethoprim Other (See Comments)    History of ulcer so was advised by MD not to take    No past surgical history on file.  Social History   Tobacco Use  . Smoking status: Never Smoker  . Smokeless tobacco: Never Used  Substance Use Topics  . Alcohol use: Not Currently    Alcohol/week: 1.0 standard drink    Types: 1 Glasses of wine per week    Comment: occasional  . Drug use: Never     Medication list has been reviewed and updated.  Current Meds  Medication Sig  . acetaminophen (TYLENOL) 500 MG tablet Take by mouth as needed.   . DERMA-SMOOTHE/FS SCALP 0.01 % OIL Apply 1 application topically at bedtime. derm  . erythromycin base (E-MYCIN) 500 MG tablet Take 1 tablet (500 mg total) by mouth 2 (two) times daily.  Marland Kitchen RETIN-A 0.05 % cream Apply 1 application topically at bedtime. derm  . spironolactone (ALDACTONE) 100 MG tablet Take 100 mg by mouth 2 (two) times daily. derm  . WINLEVI 1 % CREA Apply topically. derm  . [DISCONTINUED] Prenatal Vit-Fe Fumarate-FA (PNV PRENATAL PLUS MULTIVITAMIN) 27-1 MG TABS Take by mouth.    PHQ 2/9 Scores 05/31/2020 10/13/2019 07/28/2019 06/09/2019  PHQ - 2 Score 0 0 0 0  PHQ- 9 Score 0 0 0 0    GAD 7 : Generalized Anxiety Score 05/31/2020 10/13/2019 07/28/2019 06/09/2019  Nervous, Anxious, on Edge 0 0 0  0  Control/stop worrying 0 0 0 0  Worry too much - different things 0 0 0 0  Trouble relaxing 0 0 0 0  Restless 0 0 0 0  Easily annoyed or irritable 0 0 0 0  Afraid - awful might happen 0 0 0 0  Total GAD 7 Score 0 0 0 0  Anxiety Difficulty - Not difficult at all - -    BP Readings from Last 3 Encounters:  05/31/20 120/80  10/13/19 112/82  07/28/19 120/70    Physical Exam Vitals and nursing note reviewed.  Constitutional:      Appearance: She is well-developed.  HENT:     Head: Normocephalic.     Right Ear: Tympanic membrane, ear canal and external ear normal.  There is no impacted cerumen.     Left Ear: Tympanic membrane, ear canal and external ear normal. There is no impacted cerumen.     Nose: Nose normal.  Eyes:     General: Lids are everted, no foreign bodies appreciated. No scleral icterus.       Left eye: No foreign body or hordeolum.     Conjunctiva/sclera: Conjunctivae normal.     Right eye: Right conjunctiva is not injected.     Left eye: Left conjunctiva is not injected.     Pupils: Pupils are equal, round, and reactive to light.  Neck:     Thyroid: No thyromegaly.     Vascular: No JVD.     Trachea: No tracheal deviation.  Cardiovascular:     Rate and Rhythm: Normal rate and regular rhythm.     Heart sounds: Normal heart sounds. No murmur heard. No friction rub. No gallop.   Pulmonary:     Effort: Pulmonary effort is normal. No respiratory distress.     Breath sounds: Normal breath sounds. No wheezing or rales.  Abdominal:     General: Bowel sounds are normal.     Palpations: Abdomen is soft. There is no mass.     Tenderness: There is no abdominal tenderness. There is no guarding or rebound.  Musculoskeletal:        General: No tenderness. Normal range of motion.     Cervical back: Normal range of motion and neck supple.  Lymphadenopathy:     Cervical: No cervical adenopathy.  Skin:    General: Skin is warm.     Findings: No rash.  Neurological:     Mental Status: She is alert and oriented to person, place, and time.     Cranial Nerves: No cranial nerve deficit.     Deep Tendon Reflexes: Reflexes normal.  Psychiatric:        Mood and Affect: Mood is not anxious or depressed.     Wt Readings from Last 3 Encounters:  05/31/20 130 lb (59 kg)  10/13/19 132 lb (59.9 kg)  07/28/19 136 lb (61.7 kg)    BP 120/80   Pulse 72   Ht 5\' 2"  (1.575 m)   Wt 130 lb (59 kg)   BMI 23.78 kg/m   Assessment and Plan: 1. Encounter for counseling and instruction in natural family planning to avoid pregnancy Patient in follow-up  for options for contraceptive management.  Patient is doing well with her acne regimen which currently has had significantly great results for clearing of her acneiform eruption on her face and is doing well on spironolactone and Retin-A.  However we still have need for contraceptive management and patient is not as receptive to taking  a pill as she is to some other means.  We are referring to GYN for review of other means such as IUD, Nexplanon, Depo-Provera, and NuvaRing.  Information has been given on some of these and this will will be in preparation for her GYN referral in the near future for discussion of direction. - Ambulatory referral to Gynecology

## 2020-05-31 NOTE — Patient Instructions (Addendum)
Etonogestrel; Ethinyl Estradiol Vaginal Ring What is this medicine? ETONOGESTREL; ETHINYL ESTRADIOL (et oh noe JES trel; ETH in il es tra DYE ole) vaginal ring is a flexible, vaginal ring used as a contraceptive (birth control method). This product combines two types of female hormones, an estrogen and a progestin. It is used to prevent ovulation and pregnancy. Each ring is effective for 1 month. This medicine may be used for other purposes; ask your health care provider or pharmacist if you have questions. COMMON BRAND NAME(S): EluRyng, NuvaRing What should I tell my health care provider before I take this medicine? They need to know if you have any of these conditions:  abnormal vaginal bleeding blood vessel disease or blood clotsEtonogestrel implant What is this medicine? ETONOGESTREL (et oh noe JES trel) is a contraceptive (birth control) device. It is used to prevent pregnancy. It can be used for up to 3 years. This medicine may be used for other purposes; ask your health care provider or pharmacist if you have questions. COMMON BRAND NAME(S): Implanon, Nexplanon What should I tell my health care provider before I take this medicine? They need to know if you have any of these conditions:  abnormal vaginal bleeding  blood vessel disease or blood clots  breast, cervical, endometrial, ovarian, liver, or uterine cancer  diabetes  gallbladder disease  heart disease or recent heart attack  high blood pressure  high cholesterol or triglycerides  kidney disease  liver disease  migraine headaches  seizures  stroke  tobacco smoker  an unusual or allergic reaction to etonogestrel, anesthetics or antiseptics, other medicines, foods, dyes, or preservatives  pregnant or trying to get pregnant  breast-feeding How should I use this medicine? This device is inserted just under the skin on the inner side of your upper arm by a health care professional. Talk to your pediatrician  regarding the use of this medicine in children. Special care may be needed. Overdosage: If you think you have taken too much of this medicine contact a poison control center or emergency room at once. NOTE: This medicine is only for you. Do not share this medicine with others. What if I miss a dose? This does not apply. What may interact with this medicine? Do not take this medicine with any of the following medications:  amprenavir  fosamprenavir This medicine may also interact with the following medications:  acitretin  aprepitant  armodafinil  bexarotene  bosentan  carbamazepine  certain medicines for fungal infections like fluconazole, ketoconazole, itraconazole and voriconazole  certain medicines to treat hepatitis, HIV or AIDS  cyclosporine  felbamate  griseofulvin  lamotrigine  modafinil  oxcarbazepine  phenobarbital  phenytoin  primidone  rifabutin  rifampin  rifapentine  St. John's wort  topiramate This list may not describe all possible interactions. Give your health care provider a list of all the medicines, herbs, non-prescription drugs, or dietary supplements you use. Also tell them if you smoke, drink alcohol, or use illegal drugs. Some items may interact with your medicine. What should I watch for while using this medicine? This product does not protect you against HIV infection (AIDS) or other sexually transmitted diseases. You should be able to feel the implant by pressing your fingertips over the skin where it was inserted. Contact your doctor if you cannot feel the implant, and use a non-hormonal birth control method (such as condoms) until your doctor confirms that the implant is in place. Contact your doctor if you think that the implant may have  broken or become bent while in your arm. You will receive a user card from your health care provider after the implant is inserted. The card is a record of the location of the implant in your  upper arm and when it should be removed. Keep this card with your health records. What side effects may I notice from receiving this medicine? Side effects that you should report to your doctor or health care professional as soon as possible:  allergic reactions like skin rash, itching or hives, swelling of the face, lips, or tongue  breast lumps, breast tissue changes, or discharge  breathing problems  changes in emotions or moods  coughing up blood  if you feel that the implant may have broken or bent while in your arm  high blood pressure  pain, irritation, swelling, or bruising at the insertion site  scar at site of insertion  signs of infection at the insertion site such as fever, and skin redness, pain or discharge  signs and symptoms of a blood clot such as breathing problems; changes in vision; chest pain; severe, sudden headache; pain, swelling, warmth in the leg; trouble speaking; sudden numbness or weakness of the face, arm or leg  signs and symptoms of liver injury like dark yellow or brown urine; general ill feeling or flu-like symptoms; light-colored stools; loss of appetite; nausea; right upper belly pain; unusually weak or tired; yellowing of the eyes or skin  unusual vaginal bleeding, discharge Side effects that usually do not require medical attention (report to your doctor or health care professional if they continue or are bothersome):  acne  breast pain or tenderness  headache  irregular menstrual bleeding  nausea This list may not describe all possible side effects. Call your doctor for medical advice about side effects. You may report side effects to FDA at 1-800-FDA-1088. Where should I keep my medicine? This drug is given in a hospital or clinic and will not be stored at home. NOTE: This sheet is a summary. It may not cover all possible information. If you have questions about this medicine, talk to your doctor, pharmacist, or health care  provider.  2021 Elsevier/Gold Standard (2018-10-29 11:33:04)  Contraceptive Injection A contraceptive injection is a shot that prevents pregnancy. It is also called a birth control shot. The shot contains the hormone progestin, which prevents pregnancy by: Stopping the ovaries from releasing eggs. Thickening cervical mucus to prevent sperm from entering the cervix. Thinning the lining of the uterus to prevent a fertilized egg from attaching to the uterus. Contraceptive injections are given under the skin (subcutaneous) or into a muscle (intramuscular). For these shots to work, you must get one of them every 3 months (12-13 weeks) from a health care provider. Tell a health care provider about: Any allergies you have. All medicines you are taking, including vitamins, herbs, eye drops, creams, and over-the-counter medicines. Any blood disorders you have. Any medical conditions you have. Whether you are pregnant or may be pregnant. What are the risks? Generally, this is a safe procedure. However, problems may occur, including: Mood changes or depression. Loss of bone density (osteoporosis) after long-term use. Blood clots. This is rare. Higher risk of an egg being fertilized outside your uterus (ectopic pregnancy).This is rare. What happens before the procedure? Your health care provider may do a routine physical exam. You may have a test to make sure you are not pregnant. What happens during the procedure? The area where the shot will be given will  be cleaned and sanitized with alcohol. A needle will be inserted into a muscle in your upper arm or buttock, or into the skin of your thigh or abdomen. The needle will be attached to a syringe with the medicine inside of it. The medicine will be pushed through the syringe and injected into your body. A small bandage (dressing) may be placed over the injection site.   What can I expect after the procedure? After the procedure, it is common to  have: Soreness around the injection site for a couple of days. Irregular menstrual bleeding. Weight gain. Breast tenderness. Headaches. Discomfort in your abdomen. Ask your health care provider whether you need to use an added method of birth control (backup contraception), such as a condom, sponge, or spermicide. If the first shot is given 1-7 days after the start of your last menstrual period, you will not need backup contraception. If the first shot is given at any other time during your menstrual cycle, you should avoid having sex. If you do have sex, you will need to use backup contraception for 7 days after you receive the shot. Follow these instructions at home: General instructions Take over-the-counter and prescription medicines only as told by your health care provider. Do not rub or massage the injection site. Track your menstrual periods so you will know if they become irregular. Always use a condom to protect against sexually transmitted infections (STIs). Make sure you schedule an appointment in time for your next shot and mark it on your calendar. You must get an injection every 3 months (12-13 weeks) to prevent pregnancy. Lifestyle Do not use any products that contain nicotine or tobacco. These products include cigarettes, chewing tobacco, and vaping devices, such as e-cigarettes. If you need help quitting, ask your health care provider. Eat foods that are high in calcium and vitamin D, such as milk, cheese, and salmon. Doing this may help with any loss in bone density caused by the contraceptive injection. Ask your health care provider for dietary recommendations. Contact a health care provider if you: Have nausea or vomiting. Have abnormal vaginal discharge or bleeding. Miss a menstrual period or think you might be pregnant. Experience mood changes or depression. Feel dizzy or light-headed. Have leg pain. Get help right away if you: Have chest pain or cough up  blood. Have shortness of breath. Have a severe headache that does not go away. Have numbness in any part of your body. Have slurred speech or vision problems. Have vaginal bleeding that is abnormally heavy or does not stop, or you have severe pain in your abdomen. Have depression that does not get better with treatment. If you ever feel like you may hurt yourself or others, or have thoughts about taking your own life, get help right away. Go to your nearest emergency department or: Call your local emergency services (911 in the [place] Call a suicide crisis helpline, such as the National Suicide Prevention Lifeline at 253-535-7673. This is open 24 hours a day in the [place] Text the Crisis Text Line at (971) 316-0029 (in the [place] Summary A contraceptive injection is a shot that prevents pregnancy. It is also called the birth control shot. The shot is given under the skin (subcutaneous) or into a muscle (intramuscular). After this procedure, it is common to have soreness around the injection site for a couple of days. To prevent pregnancy, the shot must be given by a health care provider every 3 months (12-13 weeks). After you have the shot,  ask your health care provider whether you need to use an added method of birth control (backup contraception), such as a condom, sponge, or spermicide. This information is not intended to replace advice given to you by your health care provider. Make sure you discuss any questions you have with your health care provider. Document Revised: 07/28/2019 Document Reviewed: 07/28/2019 Elsevier Patient Education  2021 [employer]    breast, cervical, endometrial, ovarian, liver, or uterine cancer  diabetes  gallbladder disease  having surgery  heart disease or recent heart attack  high blood pressure  high cholesterol or triglycerides  history of irregular heartbeat or heart valve problems  kidney disease  liver disease  migraine  headaches  protein C deficiency  protein S deficiency  recently had a baby, miscarriage, or abortion  stroke  systemic lupus erythematosus (SLE)  tobacco smoker  your age is more than 39 years old  an unusual or allergic reaction to estrogens, progestins, other medicines, foods, dyes, or preservatives  pregnant or trying to get pregnant  breast-feeding How should I use this medicine? Insert the ring into your vagina as directed. Follow the directions on the prescription label. The ring will remain place for 3 weeks and is then removed for a 1-week break. A new ring is inserted 1 week after the last ring was removed, on the same day of the week. Check often to make sure the ring is still in place. If the ring was out of the vagina for an unknown amount of time, you may not be protected from pregnancy. Perform a pregnancy test and call your doctor. Do not use more often than directed. A patient package insert for the product will be given with each prescription and refill. Read this sheet carefully each time. The sheet may change frequently. Contact your pediatrician regarding the use of this medicine in children. Special care may be needed. Overdosage: If you think you have taken too much of this medicine contact a poison control center or emergency room at once. NOTE: This medicine is only for you. Do not share this medicine with others. What if I miss a dose? You will need to use the ring exactly as directed. It is very important to follow the schedule every cycle. If you do not use the ring as directed, you may not be protected from pregnancy. If the ring should slip out, is lost, or if you leave it in longer or shorter than you should, contact your health care professional for advice. What may interact with this medicine? Do not take this medicine with the following medications:  dasabuvir; ombitasvir; paritaprevir; ritonavir  ombitasvir; paritaprevir; ritonavir  vaginal  lubricants or other vaginal products that are oil-based or silicone-based This medicine may also interact with the following medications:  acetaminophen  antibiotics or medicines for infections, especially rifampin, rifabutin, rifapentine, and griseofulvin, and possibly penicillins or tetracyclines  aprepitant or fosaprepitant  armodafinil  ascorbic acid (vitamin C)  barbiturate medicines, such as phenobarbital or primidone  bosentan  certain antiviral medicines for hepatitis, HIV or AIDS  certain medicines for cancer treatment  certain medicines for seizures like carbamazepine, clobazam, felbamate, lamotrigine, oxcarbazepine, phenytoin, rufinamide, topiramate  certain medicines for treating high cholesterol  cyclosporine  dantrolene  elagolix  flibanserin  grapefruit juice  lesinurad  medicines for diabetes  medicines to treat fungal infections, such as griseofulvin, miconazole, fluconazole, ketoconazole, itraconazole, posaconazole or voriconazole  mifepristone  mitotane  modafinil  morphine  mycophenolate  St. John's wort  tamoxifen  temazepam  theophylline or aminophylline  thyroid hormones  tizanidine  tranexamic acid  ulipristal  warfarin This list may not describe all possible interactions. Give your health care provider a list of all the medicines, herbs, non-prescription drugs, or dietary supplements you use. Also tell them if you smoke, drink alcohol, or use illegal drugs. Some items may interact with your medicine. What should I watch for while using this medicine? Visit your doctor or health care professional for regular checks on your progress. You will need a regular breast and pelvic exam and Pap smear while on this medicine. Check with your doctor or health care professional to see if you need an additional method of contraception during the first cycle that you use this ring. Female condoms (made with natural rubber latex,  polyisoprene, and polyurethane) and spermicides may be used. Do not use a diaphragm, cervical cap, or a female condom, as the ring can interfere with these birth control methods and their proper placement. If you have any reason to think you are pregnant, stop using this medicine right away and contact your doctor or health care professional. If you are using this medicine for hormone related problems, it may take several cycles of use to see improvement in your condition. Smoking increases the risk of getting a blood clot or having a stroke while you are using hormonal birth control, especially if you are more than 39 years old. You are strongly advised not to smoke. Some women are prone to getting dark patches on the skin of the face (cholasma). Your risk of getting chloasma with this medicine is higher if you had chloasma during a pregnancy. Keep out of the sun. If you cannot avoid being in the sun, wear protective clothing and use sunscreen. Do not use sun lamps or tanning beds/booths. This medicine can make your body retain fluid, making your fingers, hands, or ankles swell. Your blood pressure can go up. Contact your doctor or health care professional if you feel you are retaining fluid. If you are going to have elective surgery, you may need to stop using this medicine before the surgery. Consult your health care professional for advice. This medicine does not protect you against HIV infection (AIDS) or any other sexually transmitted diseases. What side effects may I notice from receiving this medicine? Side effects that you should report to your doctor or health care professional as soon as possible:  allergic reactions such as skin rash or itching, hives, swelling of the lips, mouth, tongue, or throat  depression  high blood pressure  migraines or severe, sudden headaches  signs and symptoms of a blood clot such as breathing problems; changes in vision; chest pain; severe, sudden  headache; pain, swelling, warmth in the leg; trouble speaking; sudden numbness or weakness of the face, arm or leg  signs and symptoms of infection like fever or chills with dizziness and a sunburn-like rash, or pain or trouble passing urine  stomach pain  symptoms of vaginal infection like itching, irritation or unusual discharge  yellowing of the eyes or skin Side effects that usually do not require medical attention (report these to your doctor or health care professional if they continue or are bothersome):  acne  breast pain, tenderness  irregular vaginal bleeding or spotting, particularly during the first month of use  mild headache  nausea  painful periods  vomiting This list may not describe all possible side effects. Call your doctor for medical advice about side effects.  You may report side effects to FDA at 1-800-FDA-1088. Where should I keep my medicine? Keep out of the reach of children. Store unopened medicine for up to 4 months at room temperature at 15 and 30 degrees C (59 and 86 degrees F). Protect from light. Do not store above 30 degrees C (86 degrees F). Throw away any unused medicine 4 months after the dispense date or the expiration date, whichever comes first. A ring may only be used for 1 cycle (1 month). After the 3-week cycle, a used ring is removed and should be placed in the re-closable foil pouch and discarded in the trash out of reach of children and pets. Do NOT flush down the toilet. NOTE: This sheet is a summary. It may not cover all possible information. If you have questions about this medicine, talk to your doctor, pharmacist, or health care provider.  2021 Elsevier/Gold Standard (2018-12-04 60:63:01)

## 2020-06-16 ENCOUNTER — Ambulatory Visit (INDEPENDENT_AMBULATORY_CARE_PROVIDER_SITE_OTHER): Payer: Medicaid Other | Admitting: Obstetrics and Gynecology

## 2020-06-16 ENCOUNTER — Other Ambulatory Visit: Payer: Self-pay

## 2020-06-16 ENCOUNTER — Encounter: Payer: Self-pay | Admitting: Obstetrics and Gynecology

## 2020-06-16 VITALS — BP 113/76 | Ht 62.0 in | Wt 125.0 lb

## 2020-06-16 DIAGNOSIS — Z30016 Encounter for initial prescription of transdermal patch hormonal contraceptive device: Secondary | ICD-10-CM | POA: Diagnosis not present

## 2020-06-16 MED ORDER — XULANE 150-35 MCG/24HR TD PTWK: 1.0000 | MEDICATED_PATCH | TRANSDERMAL | 4 refills | Status: DC

## 2020-06-16 NOTE — Progress Notes (Signed)
Obstetrics & Gynecology Office Visit   Chief Complaint  Patient presents with  . Contraception   The patient is seen in referral at the request of Duanne Limerick, MD from [facility] for contraception.   History of Present Illness: 40 y.o. [phone] female who is seen in referral from Duanne Limerick, MD from [facility] for contraception.    Patient is a 40 y.o. G1P0 presenting for contraception consult.  She is currently on no method of contraception and desiring to start some form of contraception and would like counseling as to the different options.  She has a past medical history significant for no contraindication to estrogen.  She specifically denies a history of migraine with aura, chronic hypertension, history of DVT/PE and smoking.  Reported Patient's last menstrual period was 06/06/2020.Marland Kitchen    She has been on the patch before and she thinks this may be the reason she had an acne breakout. She was only on the patch for about 4 months or so.     Past Medical History:  Diagnosis Date  . Positive TB test     Past Surgical History:  Procedure Laterality Date  . UMBILICAL HERNIA REPAIR     as infant    Gynecologic History: Patient's last menstrual period was 06/06/2020.  Obstetric History: G1P0  Family History  Problem Relation Age of Onset  . Cancer Father   . Breast cancer Neg Hx   . Ovarian cancer Neg Hx     Social History   Socioeconomic History  . Marital status: Married    Spouse name: Not on file  . Number of children: Not on file  . Years of education: Not on file  . Highest education level: Not on file  Occupational History  . Not on file  Tobacco Use  . Smoking status: Never Smoker  . Smokeless tobacco: Never Used  Vaping Use  . Vaping Use: Never used  Substance and Sexual Activity  . Alcohol use: Not Currently    Alcohol/week: 1.0 standard drink    Types: 1 Glasses of wine per week    Comment: occasional  . Drug use: Never   . Sexual activity: Not Currently  Other Topics Concern  . Not on file  Social History Narrative  . Not on file   Social Determinants of Health   Financial Resource Strain: Not on file  Food Insecurity: Not on file  Transportation Needs: Not on file  Physical Activity: Not on file  Stress: Not on file  Social Connections: Not on file  Intimate Partner Violence: Not on file    Allergies  Allergen Reactions  . Aspirin Other (See Comments)    ulcers Ulcer, told not to take it   . Sulfamethoxazole-Trimethoprim Other (See Comments)    History of ulcer so was advised by MD not to take    Prior to Admission medications   Medication Sig Start Date End Date Taking? Authorizing Provider  acetaminophen (TYLENOL) 500 MG tablet Take by mouth as needed.    Yes [provider]  DERMA-SMOOTHE/FS SCALP 0.01 % OIL Apply 1 application topically at bedtime. derm 03/01/20  Yes [provider]  RETIN-A 0.05 % cream Apply 1 application topically at bedtime. derm 02/25/20  Yes [provider]  spironolactone (ALDACTONE) 100 MG tablet Take 100 mg by mouth 2 (two) times daily. derm 05/26/20  Yes [provider]  WINLEVI 1 % CREA Apply topically. derm 03/17/20  Yes [provider]  erythromycin base (E-MYCIN) 500 MG tablet Take 1 tablet (500 mg total) by mouth 2 (two) times daily. Patient not taking: Reported on 06/16/2020 10/13/19   Duanne Limerick, MD    Review of Systems  Constitutional: Negative.   HENT: Negative.   Eyes: Negative.   Respiratory: Negative.   Cardiovascular: Negative.   Gastrointestinal: Negative.   Genitourinary: Negative.   Musculoskeletal: Negative.   Skin: Negative.   Neurological: Negative.   Psychiatric/Behavioral: Negative.      Physical Exam BP 113/76   Ht 5\' 2"  (1.575 m)   Wt 125 lb (56.7 kg)   LMP 06/06/2020   BMI 22.86 kg/m  Patient's last menstrual period was 06/06/2020. Physical Exam Constitutional:       General: She is not in acute distress.    Appearance: Normal appearance.  HENT:     Head: Normocephalic and atraumatic.  Eyes:     General: No scleral icterus.    Conjunctiva/sclera: Conjunctivae normal.  Neurological:     General: No focal deficit present.     Mental Status: She is alert and oriented to person, place, and time.     Cranial Nerves: No cranial nerve deficit.  Psychiatric:        Mood and Affect: Mood normal.        Behavior: Behavior normal.        Judgment: Judgment normal.     Female chaperone present for pelvic and breast  portions of the physical exam  Assessment: 41 y.o. G1P0 female here for  1. Encounter for initial prescription of transdermal patch hormonal contraceptive device      Plan: Problem List Items Addressed This Visit   None   Visit Diagnoses    Encounter for initial prescription of transdermal patch hormonal contraceptive device    -  Primary   Relevant Medications   norelgestromin-ethinyl estradiol 24) 150-35 MCG/24HR transdermal patch     Reviewed all forms of birth control options available including abstinence; over the counter/barrier methods; hormonal contraceptive medication including pill, patch, ring, injection,contraceptive implant; hormonal and nonhormonal IUDs; permanent sterilization options including vasectomy and the various tubal sterilization modalities. Risks and benefits reviewed.  Questions were answered.    Burr Medico, MD 06/16/2020 4:13 PM    CC: 06/18/2020, MD [address] [place],  [place]

## 2020-06-21 DIAGNOSIS — Z20822 Contact with and (suspected) exposure to covid-19: Secondary | ICD-10-CM | POA: Diagnosis not present

## 2020-08-09 ENCOUNTER — Encounter: Payer: Medicaid Other | Admitting: Family Medicine

## 2020-10-11 ENCOUNTER — Encounter: Payer: Self-pay | Admitting: Family Medicine

## 2020-10-11 ENCOUNTER — Ambulatory Visit (INDEPENDENT_AMBULATORY_CARE_PROVIDER_SITE_OTHER): Payer: Medicaid Other | Admitting: Family Medicine

## 2020-10-11 ENCOUNTER — Other Ambulatory Visit: Payer: Self-pay

## 2020-10-11 VITALS — BP 100/80 | HR 80 | Ht 62.0 in | Wt 129.0 lb

## 2020-10-11 DIAGNOSIS — Z23 Encounter for immunization: Secondary | ICD-10-CM

## 2020-10-11 DIAGNOSIS — Z0000 Encounter for general adult medical examination without abnormal findings: Secondary | ICD-10-CM | POA: Diagnosis not present

## 2020-10-11 NOTE — Progress Notes (Signed)
Date:  10/11/2020   Name:  Georgina Krist   DOB:  1980-05-05   MRN:  [id number]   Chief Complaint: Annual Exam (No pap/ breast exam)  Keyoni Lapinski is a 40 y.o. female who presents today for her Complete Annual Exam. She feels well. She reports exercising  try to. She reports she is sleeping well.     Lab Results  Component Value Date   CREATININE 0.66 07/28/2019   BUN 9 07/28/2019   NA 140 07/28/2019   K 4.8 07/28/2019   CL 103 07/28/2019   CO2 22 07/28/2019   Lab Results  Component Value Date   CHOL 192 07/28/2019   HDL 66 07/28/2019   LDLCALC 114 (H) 07/28/2019   TRIG 65 07/28/2019   No results found for: TSH No results found for: HGBA1C Lab Results  Component Value Date   WBC 6.5 07/28/2019   HGB 13.1 07/28/2019   HCT 41.4 07/28/2019   MCV 87 07/28/2019   PLT 311 07/28/2019   No results found for: ALT, AST, GGT, ALKPHOS, BILITOT   Review of Systems  Constitutional:  Negative for chills and fever.  HENT:  Negative for drooling, ear discharge, ear pain and sore throat.   Respiratory:  Negative for cough, shortness of breath and wheezing.   Cardiovascular:  Negative for chest pain, palpitations and leg swelling.  Gastrointestinal:  Negative for abdominal pain, blood in stool, constipation, diarrhea and nausea.  Endocrine: Negative for polydipsia.  Genitourinary:  Negative for dysuria, frequency, hematuria and urgency.  Musculoskeletal:  Negative for back pain, myalgias and neck pain.  Skin:  Negative for rash.  Allergic/Immunologic: Negative for environmental allergies.  Neurological:  Negative for dizziness and headaches.  Hematological:  Does not bruise/bleed easily.  Psychiatric/Behavioral:  Negative for suicidal ideas. The patient is not nervous/anxious.    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   Abnormal maternal serum screening test 09/15/2016    Allergies  Allergen Reactions   Aspirin  Other (See Comments)    ulcers Ulcer, told not to take it    Sulfamethoxazole-Trimethoprim Other (See Comments)    History of ulcer so was advised by MD not to take    Past Surgical History:  Procedure Laterality Date   UMBILICAL HERNIA REPAIR     as infant    Social History   Tobacco Use   Smoking status: Never   Smokeless tobacco: Never  Vaping Use   Vaping Use: Never used  Substance Use Topics   Alcohol use: Not Currently    Alcohol/week: 1.0 standard drink    Types: 1 Glasses of wine per week    Comment: occasional   Drug use: Never     Medication list has been reviewed and updated.  Current Meds  Medication Sig   DERMA-SMOOTHE/FS SCALP 0.01 % OIL Apply 1 application topically at bedtime. derm   RETIN-A 0.05 % cream Apply 1 application topically at bedtime. derm   spironolactone (ALDACTONE) 100 MG tablet Take 100 mg by mouth 2 (two) times daily. derm   WINLEVI 1 % CREA Apply topically. derm   [DISCONTINUED] acetaminophen (TYLENOL) 500 MG tablet Take by mouth as needed.     PHQ 2/9 Scores 10/11/2020 05/31/2020 10/13/2019 07/28/2019  PHQ - 2 Score 0 0 0 0  PHQ- 9 Score 0 0 0 0    GAD 7 : Generalized Anxiety Score 10/11/2020 05/31/2020 10/13/2019 07/28/2019  Nervous, Anxious, on  Edge 0 0 0 0  Control/stop worrying 0 0 0 0  Worry too much - different things 0 0 0 0  Trouble relaxing 0 0 0 0  Restless 0 0 0 0  Easily annoyed or irritable 0 0 0 0  Afraid - awful might happen 0 0 0 0  Total GAD 7 Score 0 0 0 0  Anxiety Difficulty - - Not difficult at all -    BP Readings from Last 3 Encounters:  10/11/20 100/80  06/16/20 113/76  05/31/20 120/80    Physical Exam Vitals and nursing note reviewed.  Constitutional:      Appearance: Normal appearance. She is well-developed, well-groomed and normal weight.  HENT:     Head: Normocephalic.     Jaw: There is normal jaw occlusion.     Right Ear: Hearing, tympanic membrane, ear canal and external ear normal.     Left  Ear: Hearing, tympanic membrane, ear canal and external ear normal.     Nose: Nose normal. No congestion or rhinorrhea.     Right Turbinates: Not enlarged.     Left Turbinates: Not enlarged.     Mouth/Throat:     Lips: Pink.     Mouth: Mucous membranes are moist.     Dentition: Normal dentition.     Tongue: No lesions.     Palate: No mass.     Pharynx: Oropharynx is clear.  Eyes:     General: Lids are normal. Lids are everted, no foreign bodies appreciated. Gaze aligned appropriately. No scleral icterus.       Left eye: No foreign body or hordeolum.     Extraocular Movements: Extraocular movements intact.     Conjunctiva/sclera: Conjunctivae normal.     Right eye: Right conjunctiva is not injected.     Left eye: Left conjunctiva is not injected.     Pupils: Pupils are equal, round, and reactive to light.     Funduscopic exam:    Right eye: Red reflex present.        Left eye: Red reflex present. Neck:     Thyroid: No thyroid mass, thyromegaly or thyroid tenderness.     Vascular: Normal carotid pulses. No carotid bruit, hepatojugular reflux or JVD.     Trachea: Trachea and phonation normal. No tracheal deviation.  Cardiovascular:     Rate and Rhythm: Normal rate and regular rhythm.     Pulses: Normal pulses.          Carotid pulses are 2+ on the right side and 2+ on the left side.      Radial pulses are 2+ on the right side and 2+ on the left side.       Femoral pulses are 2+ on the right side and 2+ on the left side.      Popliteal pulses are 2+ on the right side and 2+ on the left side.       Dorsalis pedis pulses are 2+ on the right side and 2+ on the left side.       Posterior tibial pulses are 2+ on the right side and 2+ on the left side.     Heart sounds: Normal heart sounds, S1 normal and S2 normal. No murmur heard. No systolic murmur is present.  No diastolic murmur is present.    No friction rub. No gallop. No S3 or S4 sounds.  Pulmonary:     Effort: Pulmonary effort  is normal. No respiratory distress.  Breath sounds: Normal breath sounds. No stridor. No decreased breath sounds, wheezing, rhonchi or rales.  Chest:     Chest wall: No mass.  Breasts:    Right: Normal. No swelling, bleeding, inverted nipple, mass, nipple discharge, skin change or tenderness.     Left: Normal. No swelling, bleeding, inverted nipple, mass, nipple discharge, skin change or tenderness.  Abdominal:     General: Bowel sounds are normal.     Palpations: Abdomen is soft. There is no hepatomegaly, splenomegaly or mass.     Tenderness: There is abdominal tenderness in the suprapubic area. There is no right CVA tenderness, left CVA tenderness, guarding or rebound.     Hernia: No hernia is present. There is no hernia in the umbilical area or ventral area.  Genitourinary:    Rectum: Normal. Guaiac result negative. No mass or tenderness.  Musculoskeletal:        General: No tenderness. Normal range of motion.     Cervical back: Normal, normal range of motion and neck supple.     Thoracic back: Normal.     Lumbar back: Normal.     Right lower leg: No edema.     Left lower leg: No edema.  Lymphadenopathy:     Head:     Right side of head: No submental, submandibular or tonsillar adenopathy.     Left side of head: No submental, submandibular or tonsillar adenopathy.     Cervical: No cervical adenopathy.     Right cervical: No superficial, deep or posterior cervical adenopathy.    Left cervical: No superficial, deep or posterior cervical adenopathy.     Upper Body:     Right upper body: No supraclavicular or axillary adenopathy.     Left upper body: No supraclavicular or axillary adenopathy.  Skin:    General: Skin is warm.     Capillary Refill: Capillary refill takes less than 2 seconds.     Findings: No rash.  Neurological:     General: No focal deficit present.     Mental Status: She is alert and oriented to person, place, and time.     Cranial Nerves: Cranial nerves are  intact. No cranial nerve deficit.     Sensory: Sensation is intact.     Motor: Motor function is intact.     Deep Tendon Reflexes: Reflexes normal.     Reflex Scores:      Tricep reflexes are 2+ on the right side and 2+ on the left side.      Bicep reflexes are 2+ on the right side and 2+ on the left side.      Brachioradialis reflexes are 2+ on the right side and 2+ on the left side.      Patellar reflexes are 2+ on the right side and 2+ on the left side.      Achilles reflexes are 2+ on the right side and 2+ on the left side. Psychiatric:        Mood and Affect: Mood is not anxious or depressed.    Wt Readings from Last 3 Encounters:  10/11/20 129 lb (58.5 kg)  06/16/20 125 lb (56.7 kg)  05/31/20 130 lb (59 kg)    BP 100/80   Pulse 80   Ht 5\' 2"  (1.575 m)   Wt 129 lb (58.5 kg)   BMI 23.59 kg/m   Assessment and Plan:  Unnamed Hino is a 40 y.o. female who presents today for her Complete Annual Exam. She  feels well. She reports exercising as able. She reports she is sleeping well.   Patient's chart was reviewed from HPI, most recent labs, most recent imaging in Care Everywhere. 1. Annual physical exam Immunizations are reviewed and recommendations provided.   Age appropriate screening tests are discussed. Counseling given for risk factor reduction interventions.  No subjective-objective concerns noted during HPI, review of systems, and Care Everywhere. - Lipid Panel With LDL/HDL Ratio - Renal Function Panel  2. Need for immunization against influenza Discussed and administered - Flu Vaccine QUAD 752mo+IM (Fluarix, Fluzone & Alfiuria Quad PF)

## 2020-10-12 ENCOUNTER — Other Ambulatory Visit: Payer: Self-pay

## 2020-10-12 DIAGNOSIS — L700 Acne vulgaris: Secondary | ICD-10-CM

## 2020-10-12 LAB — RENAL FUNCTION PANEL
Albumin: 4.9 g/dL — ABNORMAL HIGH (ref 3.8–4.8)
BUN/Creatinine Ratio: 13 (ref 9–23)
BUN: 12 mg/dL (ref 6–20)
CO2: 24 mmol/L (ref 20–29)
Calcium: 10.5 mg/dL — ABNORMAL HIGH (ref 8.7–10.2)
Chloride: 96 mmol/L (ref 96–106)
Creatinine, Ser: 0.92 mg/dL (ref 0.57–1.00)
Glucose: 91 mg/dL (ref 65–99)
Phosphorus: 4.9 mg/dL — ABNORMAL HIGH (ref 3.0–4.3)
Potassium: 4.9 mmol/L (ref 3.5–5.2)
Sodium: 138 mmol/L (ref 134–144)
eGFR: 81 mL/min/{1.73_m2} (ref >59)

## 2020-10-12 LAB — LIPID PANEL WITH LDL/HDL RATIO
Cholesterol, Total: 206 mg/dL — ABNORMAL HIGH (ref 100–199)
HDL: 83 mg/dL (ref >39)
LDL Chol Calc (NIH): 110 mg/dL — ABNORMAL HIGH (ref 0–99)
LDL/HDL Ratio: 1.3 ratio (ref 0.0–3.2)
Triglycerides: 71 mg/dL (ref 0–149)
VLDL Cholesterol Cal: 13 mg/dL (ref 5–40)

## 2020-10-13 DIAGNOSIS — L218 Other seborrheic dermatitis: Secondary | ICD-10-CM | POA: Diagnosis not present

## 2020-10-13 DIAGNOSIS — L700 Acne vulgaris: Secondary | ICD-10-CM | POA: Diagnosis not present

## 2021-02-11 ENCOUNTER — Ambulatory Visit: Payer: Medicaid Other | Admitting: Family Medicine

## 2021-02-14 ENCOUNTER — Ambulatory Visit: Payer: Medicaid Other | Admitting: Family Medicine

## 2021-02-14 ENCOUNTER — Other Ambulatory Visit: Payer: Self-pay

## 2021-02-14 ENCOUNTER — Encounter: Payer: Self-pay | Admitting: Family Medicine

## 2021-02-14 NOTE — Progress Notes (Signed)
Date:  02/14/2021   Name:  Erika West   DOB:  [birth year]   MRN:  [id number]   Chief Complaint: hypercalcemia (Calcium was a little over 10 in September- wanted to recheck this )  Patient is a 41 year old female who presents for a elevated calcium/phosphorus/albumin exam. The patient reports the following problems: as noted. Health maintenance has been reviewed upto date.     Lab Results  Component Value Date   NA 138 10/11/2020   K 4.9 10/11/2020   CO2 24 10/11/2020   GLUCOSE 91 10/11/2020   BUN 12 10/11/2020   CREATININE 0.92 10/11/2020   CALCIUM 10.5 (H) 10/11/2020   EGFR 81 10/11/2020   GFRNONAA 112 07/28/2019   Lab Results  Component Value Date   CHOL 206 (H) 10/11/2020   HDL 83 10/11/2020   LDLCALC 110 (H) 10/11/2020   TRIG 71 10/11/2020   No results found for: TSH No results found for: HGBA1C Lab Results  Component Value Date   WBC 6.5 07/28/2019   HGB 13.1 07/28/2019   HCT 41.4 07/28/2019   MCV 87 07/28/2019   PLT 311 07/28/2019   No results found for: ALT, AST, GGT, ALKPHOS, BILITOT No results found for: 25OHVITD2, 25OHVITD3, VD25OH   Review of Systems  Constitutional:  Negative for chills and fever.  HENT:  Negative for drooling, ear discharge, ear pain and sore throat.   Respiratory:  Negative for cough, shortness of breath and wheezing.   Cardiovascular:  Negative for chest pain, palpitations and leg swelling.  Gastrointestinal:  Negative for abdominal pain, blood in stool, constipation, diarrhea and nausea.  Endocrine: Negative for polydipsia.  Genitourinary:  Negative for dysuria, frequency, hematuria and urgency.  Musculoskeletal:  Negative for back pain, myalgias and neck pain.  Skin:  Negative for rash.  Allergic/Immunologic: Negative for environmental allergies.  Neurological:  Negative for dizziness, tremors and headaches.  Hematological:  Does not bruise/bleed easily.  Psychiatric/Behavioral:  Negative for suicidal ideas. The  patient is not nervous/anxious.    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   Abnormal maternal serum screening test 09/15/2016    Allergies  Allergen Reactions   Aspirin Other (See Comments)    ulcers Ulcer, told not to take it    Sulfamethoxazole-Trimethoprim Other (See Comments)    History of ulcer so was advised by MD not to take    Past Surgical History:  Procedure Laterality Date   UMBILICAL HERNIA REPAIR     as infant    Social History   Tobacco Use   Smoking status: Never   Smokeless tobacco: Never  Vaping Use   Vaping Use: Never used  Substance Use Topics   Alcohol use: Not Currently    Alcohol/week: 1.0 standard drink    Types: 1 Glasses of wine per week    Comment: occasional   Drug use: Never     Medication list has been reviewed and updated.  Current Meds  Medication Sig   DERMA-SMOOTHE/FS SCALP 0.01 % OIL Apply 1 application topically at bedtime. derm   RETIN-A 0.05 % cream Apply 1 application topically at bedtime. derm   spironolactone (ALDACTONE) 100 MG tablet Take 100 mg by mouth 2 (two) times daily. derm   WINLEVI 1 % CREA Apply topically. derm    PHQ 2/9 Scores 02/14/2021 10/11/2020 05/31/2020 10/13/2019  PHQ - 2 Score 0 0 0 0  PHQ- 9 Score 0 0 0 0  GAD 7 : Generalized Anxiety Score 02/14/2021 10/11/2020 05/31/2020 10/13/2019  Nervous, Anxious, on Edge 0 0 0 0  Control/stop worrying 0 0 0 0  Worry too much - different things 0 0 0 0  Trouble relaxing 0 0 0 0  Restless 0 0 0 0  Easily annoyed or irritable 0 0 0 0  Afraid - awful might happen 0 0 0 0  Total GAD 7 Score 0 0 0 0  Anxiety Difficulty Not difficult at all - - Not difficult at all    BP Readings from Last 3 Encounters:  02/14/21 120/84  10/11/20 100/80  06/16/20 113/76    Physical Exam Vitals and nursing note reviewed.  Constitutional:      Appearance: She is well-developed.  HENT:     Head: Normocephalic.     Right Ear: Tympanic  membrane, ear canal and external ear normal.     Left Ear: Tympanic membrane, ear canal and external ear normal.     Nose: Nose normal.  Eyes:     General: Lids are everted, no foreign bodies appreciated. No scleral icterus.       Left eye: No foreign body or hordeolum.     Conjunctiva/sclera: Conjunctivae normal.     Right eye: Right conjunctiva is not injected.     Left eye: Left conjunctiva is not injected.     Pupils: Pupils are equal, round, and reactive to light.  Neck:     Thyroid: No thyromegaly.     Vascular: No JVD.     Trachea: No tracheal deviation.  Cardiovascular:     Rate and Rhythm: Normal rate and regular rhythm.     Heart sounds: Normal heart sounds. No murmur heard.   No friction rub. No gallop.  Pulmonary:     Effort: Pulmonary effort is normal. No respiratory distress.     Breath sounds: Normal breath sounds. No wheezing, rhonchi or rales.  Abdominal:     General: Bowel sounds are normal.     Palpations: Abdomen is soft. There is no mass.     Tenderness: There is no abdominal tenderness. There is no guarding or rebound.  Musculoskeletal:        General: No tenderness. Normal range of motion.     Cervical back: Normal range of motion and neck supple.  Lymphadenopathy:     Cervical: No cervical adenopathy.  Skin:    General: Skin is warm.     Findings: No rash.  Neurological:     Mental Status: She is alert and oriented to person, place, and time.     Cranial Nerves: No cranial nerve deficit.     Deep Tendon Reflexes: Reflexes normal.     Reflex Scores:      Bicep reflexes are 2+ on the right side and 2+ on the left side.      Patellar reflexes are 2+ on the right side and 2+ on the left side. Psychiatric:        Mood and Affect: Mood is not anxious or depressed.    Wt Readings from Last 3 Encounters:  02/14/21 131 lb (59.4 kg)  10/11/20 129 lb (58.5 kg)  06/16/20 125 lb (56.7 kg)    BP 120/84    Pulse 76    Ht 5' 2"  (1.575 m)    Wt 131 lb (59.4  kg)    LMP 01/31/2021 (Approximate)    BMI 23.96 kg/m   Assessment and Plan:  1. Serum calcium elevated Patient  returns for recheck with a mild elevation of her calcium and phosphorus from previous reading 1 year ago.  Although calcium was only 10.5 previous calcium was noted to be 9.6 so I am just going to recheck to see if this may be lab error versus other concern that needs to be followed up on. - Renal Function Panel  2. Serum phosphate elevated As noted above with calcium elevated there was also noted to be an elevation of the phosphorus of 4.9.  At the edge of control of 4.8 the previous year.  We will check renal function panel for current status of phosphorus as well. - Renal Function Panel

## 2021-02-15 LAB — RENAL FUNCTION PANEL
Albumin: 4.6 g/dL (ref 3.8–4.8)
BUN/Creatinine Ratio: 10 (ref 9–23)
BUN: 8 mg/dL (ref 6–20)
CO2: 27 mmol/L (ref 20–29)
Calcium: 9.9 mg/dL (ref 8.7–10.2)
Chloride: 101 mmol/L (ref 96–106)
Creatinine, Ser: 0.77 mg/dL (ref 0.57–1.00)
Glucose: 82 mg/dL (ref 70–99)
Phosphorus: 4.1 mg/dL (ref 3.0–4.3)
Potassium: 5.1 mmol/L (ref 3.5–5.2)
Sodium: 142 mmol/L (ref 134–144)
eGFR: 101 mL/min/{1.73_m2} (ref >59)

## 2021-05-05 ENCOUNTER — Other Ambulatory Visit: Payer: Self-pay

## 2021-05-05 DIAGNOSIS — L700 Acne vulgaris: Secondary | ICD-10-CM | POA: Insufficient documentation

## 2021-10-13 DIAGNOSIS — L700 Acne vulgaris: Secondary | ICD-10-CM | POA: Diagnosis not present

## 2021-10-25 DIAGNOSIS — Z1151 Encounter for screening for human papillomavirus (HPV): Secondary | ICD-10-CM | POA: Diagnosis not present

## 2021-10-25 DIAGNOSIS — N898 Other specified noninflammatory disorders of vagina: Secondary | ICD-10-CM | POA: Diagnosis not present

## 2021-10-25 DIAGNOSIS — R634 Abnormal weight loss: Secondary | ICD-10-CM | POA: Diagnosis not present

## 2021-10-25 DIAGNOSIS — R7611 Nonspecific reaction to tuberculin skin test without active tuberculosis: Secondary | ICD-10-CM | POA: Diagnosis not present

## 2021-10-25 DIAGNOSIS — Z117 Encounter for testing for latent tuberculosis infection: Secondary | ICD-10-CM | POA: Diagnosis not present

## 2021-10-25 DIAGNOSIS — Z13220 Encounter for screening for lipoid disorders: Secondary | ICD-10-CM | POA: Diagnosis not present

## 2021-10-25 DIAGNOSIS — Z1159 Encounter for screening for other viral diseases: Secondary | ICD-10-CM | POA: Diagnosis not present

## 2021-10-25 DIAGNOSIS — Z1239 Encounter for other screening for malignant neoplasm of breast: Secondary | ICD-10-CM | POA: Diagnosis not present

## 2021-10-25 DIAGNOSIS — Z130 Encounter for screening for diseases of the blood and blood-forming organs and certain disorders involving the immune mechanism: Secondary | ICD-10-CM | POA: Diagnosis not present

## 2021-10-25 DIAGNOSIS — Z23 Encounter for immunization: Secondary | ICD-10-CM | POA: Diagnosis not present

## 2021-10-25 DIAGNOSIS — A64 Unspecified sexually transmitted disease: Secondary | ICD-10-CM | POA: Diagnosis not present

## 2021-10-25 DIAGNOSIS — Z113 Encounter for screening for infections with a predominantly sexual mode of transmission: Secondary | ICD-10-CM | POA: Diagnosis not present

## 2021-10-25 DIAGNOSIS — Z124 Encounter for screening for malignant neoplasm of cervix: Secondary | ICD-10-CM | POA: Diagnosis not present

## 2021-11-01 ENCOUNTER — Ambulatory Visit
Admission: RE | Admit: 2021-11-01 | Discharge: 2021-11-01 | Disposition: A | Discharge status: Home / Self Care | Payer: Medicaid Other | Attending: Family Medicine | Admitting: Family Medicine

## 2021-11-01 ENCOUNTER — Other Ambulatory Visit: Payer: Self-pay | Admitting: Family Medicine

## 2021-11-01 ENCOUNTER — Ambulatory Visit
Admission: RE | Admit: 2021-11-01 | Discharge: 2021-11-01 | Disposition: A | Discharge status: Home / Self Care | Payer: Medicaid Other | Source: Ambulatory Visit | Attending: Internal Medicine | Admitting: Internal Medicine

## 2021-11-01 DIAGNOSIS — R7611 Nonspecific reaction to tuberculin skin test without active tuberculosis: Secondary | ICD-10-CM | POA: Diagnosis not present

## 2021-11-01 DIAGNOSIS — R7612 Nonspecific reaction to cell mediated immunity measurement of gamma interferon antigen response without active tuberculosis: Secondary | ICD-10-CM

## 2021-11-24 DIAGNOSIS — R7612 Nonspecific reaction to cell mediated immunity measurement of gamma interferon antigen response without active tuberculosis: Secondary | ICD-10-CM | POA: Diagnosis not present

## 2021-11-24 DIAGNOSIS — L604 Beau's lines: Secondary | ICD-10-CM | POA: Diagnosis not present

## 2021-11-29 DIAGNOSIS — Z1239 Encounter for other screening for malignant neoplasm of breast: Secondary | ICD-10-CM | POA: Diagnosis not present

## 2021-11-29 DIAGNOSIS — Z1231 Encounter for screening mammogram for malignant neoplasm of breast: Secondary | ICD-10-CM | POA: Diagnosis not present

## 2022-03-27 DIAGNOSIS — R7612 Nonspecific reaction to cell mediated immunity measurement of gamma interferon antigen response without active tuberculosis: Secondary | ICD-10-CM | POA: Diagnosis not present

## 2022-03-27 DIAGNOSIS — E785 Hyperlipidemia, unspecified: Secondary | ICD-10-CM | POA: Diagnosis not present

## 2022-05-18 DIAGNOSIS — J029 Acute pharyngitis, unspecified: Secondary | ICD-10-CM | POA: Diagnosis not present

## 2022-10-16 DIAGNOSIS — L700 Acne vulgaris: Secondary | ICD-10-CM | POA: Diagnosis not present

## 2022-10-31 DIAGNOSIS — E7800 Pure hypercholesterolemia, unspecified: Secondary | ICD-10-CM | POA: Diagnosis not present

## 2022-10-31 DIAGNOSIS — Z131 Encounter for screening for diabetes mellitus: Secondary | ICD-10-CM | POA: Diagnosis not present

## 2022-10-31 DIAGNOSIS — E559 Vitamin D deficiency, unspecified: Secondary | ICD-10-CM | POA: Diagnosis not present

## 2022-10-31 DIAGNOSIS — Z130 Encounter for screening for diseases of the blood and blood-forming organs and certain disorders involving the immune mechanism: Secondary | ICD-10-CM | POA: Diagnosis not present

## 2022-10-31 DIAGNOSIS — Z13228 Encounter for screening for other metabolic disorders: Secondary | ICD-10-CM | POA: Diagnosis not present

## 2022-10-31 DIAGNOSIS — Z23 Encounter for immunization: Secondary | ICD-10-CM | POA: Diagnosis not present

## 2022-10-31 DIAGNOSIS — R7612 Nonspecific reaction to cell mediated immunity measurement of gamma interferon antigen response without active tuberculosis: Secondary | ICD-10-CM | POA: Diagnosis not present

## 2022-10-31 DIAGNOSIS — Z1329 Encounter for screening for other suspected endocrine disorder: Secondary | ICD-10-CM | POA: Diagnosis not present

## 2022-10-31 DIAGNOSIS — Z0000 Encounter for general adult medical examination without abnormal findings: Secondary | ICD-10-CM | POA: Diagnosis not present

## 2022-10-31 DIAGNOSIS — Z1239 Encounter for other screening for malignant neoplasm of breast: Secondary | ICD-10-CM | POA: Diagnosis not present

## 2022-10-31 DIAGNOSIS — N941 Unspecified dyspareunia: Secondary | ICD-10-CM | POA: Diagnosis not present

## 2022-12-01 DIAGNOSIS — Z1231 Encounter for screening mammogram for malignant neoplasm of breast: Secondary | ICD-10-CM | POA: Diagnosis not present

## 2022-12-01 DIAGNOSIS — Z1239 Encounter for other screening for malignant neoplasm of breast: Secondary | ICD-10-CM | POA: Diagnosis not present

## 2023-03-09 DIAGNOSIS — J111 Influenza due to unidentified influenza virus with other respiratory manifestations: Secondary | ICD-10-CM | POA: Diagnosis not present

## 2023-05-01 DIAGNOSIS — E785 Hyperlipidemia, unspecified: Secondary | ICD-10-CM | POA: Diagnosis not present

## 2023-05-01 DIAGNOSIS — R7612 Nonspecific reaction to cell mediated immunity measurement of gamma interferon antigen response without active tuberculosis: Secondary | ICD-10-CM | POA: Diagnosis not present

## 2023-05-01 DIAGNOSIS — E559 Vitamin D deficiency, unspecified: Secondary | ICD-10-CM | POA: Diagnosis not present

## 2023-05-01 DIAGNOSIS — N941 Unspecified dyspareunia: Secondary | ICD-10-CM | POA: Diagnosis not present

## 2023-12-03 DIAGNOSIS — Z1231 Encounter for screening mammogram for malignant neoplasm of breast: Secondary | ICD-10-CM | POA: Diagnosis not present

## 2023-12-03 DIAGNOSIS — Z1239 Encounter for other screening for malignant neoplasm of breast: Secondary | ICD-10-CM | POA: Diagnosis not present

## 2023-12-04 DIAGNOSIS — L603 Nail dystrophy: Secondary | ICD-10-CM | POA: Diagnosis not present
